# Patient Record
Sex: Male | Born: 1990
Health system: Southern US, Community
[De-identification: ages and names within clinical notes are randomized; demographics above are authoritative.]

## PROBLEM LIST (undated history)

## (undated) DIAGNOSIS — Z8489 Family history of other specified conditions: Secondary | ICD-10-CM

## (undated) DIAGNOSIS — M549 Dorsalgia, unspecified: Secondary | ICD-10-CM

## (undated) DIAGNOSIS — G8929 Other chronic pain: Secondary | ICD-10-CM

## (undated) HISTORY — PX: APPENDECTOMY: SHX54

---

## 2011-08-20 ENCOUNTER — Encounter (HOSPITAL_COMMUNITY): Payer: Self-pay | Admitting: Emergency Medicine

## 2011-08-20 ENCOUNTER — Emergency Department (HOSPITAL_COMMUNITY): Payer: Self-pay

## 2011-08-20 ENCOUNTER — Emergency Department (HOSPITAL_COMMUNITY)
Admission: EM | Admit: 2011-08-20 | Discharge: 2011-08-21 | Disposition: A | Discharge status: Home Health | Payer: Self-pay | Attending: Emergency Medicine | Admitting: Emergency Medicine

## 2011-08-20 ENCOUNTER — Emergency Department (HOSPITAL_COMMUNITY)
Admission: EM | Admit: 2011-08-20 | Discharge: 2011-08-20 | Discharge status: Against Medical Advice | Payer: Self-pay | Attending: Emergency Medicine | Admitting: Emergency Medicine

## 2011-08-20 DIAGNOSIS — Z8614 Personal history of Methicillin resistant Staphylococcus aureus infection: Secondary | ICD-10-CM | POA: Insufficient documentation

## 2011-08-20 DIAGNOSIS — Z0389 Encounter for observation for other suspected diseases and conditions ruled out: Secondary | ICD-10-CM | POA: Insufficient documentation

## 2011-08-20 DIAGNOSIS — L02619 Cutaneous abscess of unspecified foot: Secondary | ICD-10-CM | POA: Insufficient documentation

## 2011-08-20 MED ORDER — TETANUS-DIPHTH-ACELL PERTUSSIS 5-2.5-18.5 LF-MCG/0.5 IM SUSP
0.5000 mL | Freq: Once | INTRAMUSCULAR | Status: AC
  Administered 2011-08-20: 0.5 mL via INTRAMUSCULAR
  Filled 2011-08-20: qty 0.5

## 2011-08-20 MED ORDER — LIDOCAINE-EPINEPHRINE 2 %-1:100000 IJ SOLN
20.0000 mL | Freq: Once | INTRAMUSCULAR | Status: DC
  Filled 2011-08-20: qty 20

## 2011-08-20 NOTE — ED Notes (Signed)
Pt alert, nad, c/o blister like area to right foot, callous like, no s/s inflammation noted, skin pwd

## 2011-08-20 NOTE — ED Notes (Signed)
Patient leaving before triage completed--states he's going to [facility].

## 2011-08-20 NOTE — ED Provider Notes (Signed)
History     CSN: [id number]  Arrival date & time 08/20/11  2143   First MD Initiated Contact with Patient 08/20/11 2245      Chief Complaint  Patient presents with  . Wound Check    (Consider location/radiation/quality/duration/timing/severity/associated sxs/prior treatment) HPI  21 year old male with history of MRSA is presenting to the ED with chief complaints of right foot infection. Patient states for the past several days he has been helping friends moving furniture is. He has been doing a lot of walking and he has noticed a blister to the sole of his foot for the past few days. The area is very tender. He also noticed a red streak extending from that blister upward to his ankle. He denies change of shoes, foreign object sensation, or any recent trauma. He does not recall his last tetanus shot. He does have a history of MRSA infection. He denies fever, nausea, vomiting, diarrhea, or numbness. He denies pain with ankle movement.  History reviewed. No pertinent past medical history.  History reviewed. No pertinent past surgical history.  No family history on file.  History  Substance Use Topics  . Smoking status: Never Smoker   . Smokeless tobacco: Not on file  . Alcohol Use: No      Review of Systems  All other systems reviewed and are negative.    Allergies  Sulfa drugs cross reactors  Home Medications  No current outpatient prescriptions on file.  BP 128/74  Pulse 69  Temp(Src) 98.3 F (36.8 C) (Oral)  Resp 18  Wt 140 lb (63.504 kg)  SpO2 97%  Physical Exam  Nursing note and vitals reviewed. Constitutional: He appears well-developed and well-nourished. No distress.  HENT:  Head: Atraumatic.  Eyes: Conjunctivae are normal.  Neck: Neck supple.  Musculoskeletal:       Right foot: An area of induration and fluctuant with ecchymosis noted to the sole of feet. There is moderate tender to palpation. A red streak extending from it upward to medial aspects of  ankle. Right ankle with full range of motion. Wrists capillary refill is to all toes. Dorsalis pedis pulse 2+. No foreign object noted. No air noted.  Neurological: He is alert.  Skin: Skin is warm.    ED Course  Procedures (including critical care time)  Labs Reviewed - No data to display No results found.   No diagnosis found.  No results found for this or any previous visit. Dg Foot Complete Right  08/20/2011  *RADIOLOGY REPORT*  Clinical Data: Evaluate plantar soft tissue wound along the right great toe.  Assess for foreign body, soft tissue air or osteomyelitis.  RIGHT FOOT COMPLETE - 3+ VIEW  Comparison: None.  Findings: The clinically described soft tissue wound is not well characterized on radiograph.  No soft tissue air is identified. There is no evidence of osseous erosion to suggest osteomyelitis. Visualized joint spaces are preserved.  No radiopaque foreign bodies are identified.  There is no evidence of fracture.  The subtalar joint is within normal limits.  Soft tissue swelling is noted along the medial aspect of the hindfoot.  IMPRESSION: No radiopaque foreign bodies seen; no soft tissue air identified. No evidence of osseous erosion to suggest osteomyelitis.  Soft tissue swelling noted along the medial aspect of the hindfoot.  Original Report Authenticated By: Tonia Ghent, M.D.    INCISION AND DRAINAGE Performed by: Fayrene Helper Consent: Verbal consent obtained. Risks and benefits: risks, benefits and alternatives were discussed Type: abscess  Body  area: sole of Right foot  Anesthesia: local infiltration  Local anesthetic: lidocaine 2% w epinephrine  Anesthetic total: 3 ml  Complexity: complex Blunt dissection to break up loculations  Drainage: purulent  Drainage amount: minimal  Packing material: 1/4 in iodoform gauze  Patient tolerance: Patient tolerated the procedure well with no immediate complications.    MDM  An abscess noted to sole of right foot.  Mild pus or exudates extremities from wound from I&D. Lymphangitis to the affected area. X-ray shows no evidence of foreign object, osteomyelitis, or air.  I have low suspicion for necrotizing fasciitis. The wound were evaluated both by myself and my attending. Strict followup instruction given. Patient will be discharged with clinda, and pain medication.  Pt with hx of MRSA, i doubt keflex will be efficient.   Patient voices understanding and agree with plan. He has stable vital signs and is afebrile.  Tetanus shot given today in ED.  Crutches given for comfort        Fayrene Helper, PA-C 08/21/11 0016

## 2011-08-21 MED ORDER — CLINDAMYCIN HCL 300 MG PO CAPS
300.0000 mg | ORAL_CAPSULE | Freq: Once | ORAL | Status: AC
  Administered 2011-08-21: 300 mg via ORAL
  Filled 2011-08-21: qty 1

## 2011-08-21 MED ORDER — HYDROCODONE-ACETAMINOPHEN 5-325 MG PO TABS: 2.0000 | ORAL_TABLET | ORAL | Status: AC | PRN

## 2011-08-21 MED ORDER — CLINDAMYCIN HCL 150 MG PO CAPS: 150.0000 mg | ORAL_CAPSULE | Freq: Four times a day (QID) | ORAL | Status: AC

## 2011-08-21 NOTE — Discharge Instructions (Signed)
Please take antibiotic and pain medication for the full duration.  Keep packing in place for 2 days.  Return to ER or to your regular doctor for wound recheck in 2 days.  Return sooner if you develop fever, worsening extending red streak, increase pain, or if you have any other concerns.    Abscess An abscess (boil or furuncle) is an infected area that contains a collection of pus.  SYMPTOMS Signs and symptoms of an abscess include pain, tenderness, redness, or hardness. You may feel a moveable soft area under your skin. An abscess can occur anywhere in the body.  TREATMENT  A surgical cut (incision) may be made over your abscess to drain the pus. Gauze may be packed into the space or a drain may be looped through the abscess cavity (pocket). This provides a drain that will allow the cavity to heal from the inside outwards. The abscess may be painful for a few days, but should feel much better if it was drained.  Your abscess, if seen early, may not have localized and may not have been drained. If not, another appointment may be required if it does not get better on its own or with medications. HOME CARE INSTRUCTIONS   Only take over-the-counter or prescription medicines for pain, discomfort, or fever as directed by your caregiver.   Take your antibiotics as directed if they were prescribed. Finish them even if you start to feel better.   Keep the skin and clothes clean around your abscess.   If the abscess was drained, you will need to use gauze dressing to collect any draining pus. Dressings will typically need to be changed 3 or more times a day.   The infection may spread by skin contact with others. Avoid skin contact as much as possible.   Practice good hygiene. This includes regular hand washing, cover any draining skin lesions, and do not share personal care items.   If you participate in sports, do not share athletic equipment, towels, whirlpools, or personal care items. Shower after  every practice or tournament.   If a draining area cannot be adequately covered:   Do not participate in sports.   Children should not participate in day care until the wound has healed or drainage stops.   If your caregiver has given you a follow-up appointment, it is very important to keep that appointment. Not keeping the appointment could result in a much worse infection, chronic or permanent injury, pain, and disability. If there is any problem keeping the appointment, you must call back to this facility for assistance.  SEEK MEDICAL CARE IF:   You develop increased pain, swelling, redness, drainage, or bleeding in the wound site.   You develop signs of generalized infection including muscle aches, chills, fever, or a general ill feeling.   You have an oral temperature above 102 F (38.9 C).  MAKE SURE YOU:   Understand these instructions.   Will watch your condition.   Will get help right away if you are not doing well or get worse.  Document Released: 03/27/2005 Document Revised: 02/27/2011 Document Reviewed: 01/19/2008 [facility] Patient Information 2012 [place], [place].  Community-Associated MRSA CA-MRSA stands for community-associated methicillin-resistant Staphylococcus aureus. MRSA is a type of bacteria that is resistant to some common antibiotics. It can cause infections in the skin and many other places in the body. Staphylococcus aureus, often called "staph," is a bacteria that normally lives on the skin or in the nose. Staph on the surface  of the skin or in the nose does not cause problems. However, if the staph enters the body through a cut, wound, or break in the skin, an infection can happen. Up until recently, infections with the MRSA type of staph mainly occurred in hospitals and other healthcare settings. There are now increasing problems with MRSA infections in the community as well. Infections with MRSA may be very serious or even life-threatening. CA-MRSA is  becoming more common. It is known to spread in crowded settings, in jails and prisons, and in situations where there is close skin-to-skin contact, such as during sporting events or in locker rooms. MRSA can be spread through shared items, such as children's toys, razors, towels, or sports equipment.  CAUSES All staph, including MRSA, are normally harmless unless they enter the body through a scratch, cut, or wound, such as with surgery. All staph, including MRSA, can be spread from person-to-person by touching contaminated objects or through direct contact.  MRSA now causes illness in people who have not been in hospitals or other healthcare facilities. Cases of MRSA diseases in the community have been associated with:   Recent antibiotic use.   Sharing contaminated towels or clothes.   Having active skin diseases.   Participating in contact sports.   Living in crowded settings.   Intravenous (IV) drug use.   Community-associated MRSA infections are usually skin infections, but may cause other severe illnesses.   Staph bacteria are one of the most common causes of skin infection. However, they are also a common cause of pneumonia, bone or joint infections, and bloodstream infections.  DIAGNOSIS Diagnosis of MRSA is done by cultures of fluid samples that may come from:  Swabs taken from cuts or wounds in infected areas.   Nasal swabs.   Saliva or deep cough specimens from the lungs (sputum).   Urine.   Blood.  Many people are "colonized" with MRSA but have no signs of infection. This means that people carry the MRSA germ on their skin or in their nose and may never develop MRSA infection.  TREATMENT  Treatment varies and is based on how serious, how deep, or how extensive the infection is. For example:  Some skin infections, such as a small boil or abscess, may be treated by draining yellowish-white fluid (pus) from the site of the infection.   Deeper or more widespread soft  tissue infections are usually treated with surgery to drain pus and with antibiotic medicine given by vein or by mouth. This may be recommended even if you are pregnant.   Serious infections may require a hospital stay.  If antibiotics are given, they may be needed for several weeks. PREVENTION Because many people are colonized with staph, including MRSA, preventing the spread of the bacteria from person-to-person is most important. The best way to prevent the spread of bacteria and other germs is through proper hand washing or by using alcohol-based hand disinfectants. The following are other ways to help prevent MRSA infection within community settings.   Wash your hands frequently with soap and water for at least 15 seconds. Otherwise, use alcohol-based hand disinfectants when soap and water is not available.   Make sure people who live with you wash their hands often, too.   Do not share personal items. For example, avoid sharing razors and other personal hygiene items, towels, clothing, and athletic equipment.   Wash and dry your clothes and bedding at the warmest temperatures recommended on the labels.   Keep wounds  covered. Pus from infected sores may contain MRSA and other bacteria. Keep cuts and abrasions clean and covered with germ-free (sterile), dry bandages until they are healed.   If you have a wound that appears infected, ask your caregiver if a culture for MRSA and other bacteria should be done.   If you are breastfeeding, talk to your caregiver about MRSA. You may be asked to temporarily stop breastfeeding.  HOME CARE INSTRUCTIONS   Take your antibiotics as directed. Finish them even if you start to feel better.   Avoid close contact with those around you as much as possible. Do not use towels, razors, toothbrushes, bedding, or other items that will be used by others.   To fight the infection, follow your caregiver's instructions for wound care. Wash your hands before and  after changing your bandages.   If you have an intravascular device, such as a catheter, make sure you know how to care for it.   Be sure to tell any healthcare providers that you have MRSA so they are aware of your infection.  SEEK IMMEDIATE MEDICAL CARE IF:  The infection appears to be getting worse. Signs include:   Increased warmth, redness, or tenderness around the wound site.   A red line that extends from the infection site.   A dark color in the area around the infection.   Wound drainage that is tan, yellow, or green.   A bad smell coming from the wound.   You feel sick to your stomach (nauseous) and throw up (vomit) or cannot keep medicine down.   You have a fever.   Your baby is older than 3 months with a rectal temperature of 102 F (38.9 C) or higher.   Your baby is 72 months old or younger with a rectal temperature of 100.4 F (38 C) or higher.   You have difficulty breathing.  MAKE SURE YOU:   Understand these instructions.   Will watch your condition.   Will get help right away if you are not doing well or get worse.  Document Released: 09/20/2005 Document Revised: 02/27/2011 Document Reviewed: 09/20/2010 [facility] Patient Information 2012 [place], [place].  Cellulitis Cellulitis is an infection of the skin and the tissue beneath it. The area is typically red and tender. It is caused by germs (bacteria) (usually staph or strep) that enter the body through cuts or sores. Cellulitis most commonly occurs in the arms or lower legs.  HOME CARE INSTRUCTIONS   If you are given a prescription for medications which kill germs (antibiotics), take as directed until finished.   If the infection is on the arm or leg, keep the limb elevated as able.   Use a warm cloth several times per day to relieve pain and encourage healing.   See your caregiver for recheck of the infected site as directed if problems arise.   Only take over-the-counter or prescription medicines  for pain, discomfort, or fever as directed by your caregiver.  SEEK MEDICAL CARE IF:   The area of redness (inflammation) is spreading, there are red streaks coming from the infected site, or if a part of the infection begins to turn dark in color.   The joint or bone underneath the infected skin becomes painful after the skin has healed.   The infection returns in the same or another area after it seems to have gone away.   A boil or bump swells up. This may be an abscess.   New, unexplained problems such  as pain or fever develop.  SEEK IMMEDIATE MEDICAL CARE IF:   You have a fever.   You or your child feels drowsy or lethargic.   There is vomiting, diarrhea, or lasting discomfort or feeling ill (malaise) with muscle aches and pains.  MAKE SURE YOU:   Understand these instructions.   Will watch your condition.   Will get help right away if you are not doing well or get worse.  Document Released: 03/27/2005 Document Revised: 02/27/2011 Document Reviewed: 02/03/2008 [facility] Patient Information 2012 [place], [place].

## 2011-08-24 NOTE — ED Provider Notes (Signed)
Medical screening examination/treatment/procedure(s) were performed by non-physician practitioner and as supervising physician I was immediately available for consultation/collaboration.   Thereasa Iannello A. Natori Gudino, MD 08/24/11 0655 

## 2011-09-02 ENCOUNTER — Encounter (HOSPITAL_COMMUNITY): Payer: Self-pay

## 2011-09-02 ENCOUNTER — Emergency Department (HOSPITAL_COMMUNITY)
Admission: EM | Admit: 2011-09-02 | Discharge: 2011-09-02 | Disposition: A | Discharge status: Home / Self Care | Payer: No Typology Code available for payment source | Attending: Emergency Medicine | Admitting: Emergency Medicine

## 2011-09-02 DIAGNOSIS — M25519 Pain in unspecified shoulder: Secondary | ICD-10-CM | POA: Insufficient documentation

## 2011-09-02 DIAGNOSIS — M25559 Pain in unspecified hip: Secondary | ICD-10-CM | POA: Insufficient documentation

## 2011-09-02 MED ORDER — DIAZEPAM 5 MG PO TABS: 5.0000 mg | ORAL_TABLET | Freq: Two times a day (BID) | ORAL | Status: AC
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
## 2011-09-03 NOTE — ED Provider Notes (Signed)
Medical screening examination/treatment/procedure(s) were performed by non-physician practitioner and as supervising physician I was immediately available for consultation/collaboration.   Elsye Mccollister M Bryceton Hantz, MD 09/03/11 0939 

## 2012-05-26 ENCOUNTER — Emergency Department (HOSPITAL_COMMUNITY)
Admission: EM | Admit: 2012-05-26 | Discharge: 2012-05-26 | Disposition: A | Discharge status: Home / Self Care | Payer: Worker's Compensation | Attending: Emergency Medicine | Admitting: Emergency Medicine

## 2012-05-26 ENCOUNTER — Emergency Department (HOSPITAL_COMMUNITY): Payer: Worker's Compensation

## 2012-05-26 ENCOUNTER — Encounter (HOSPITAL_COMMUNITY): Payer: Self-pay | Admitting: Emergency Medicine

## 2012-05-26 DIAGNOSIS — Z79899 Other long term (current) drug therapy: Secondary | ICD-10-CM | POA: Insufficient documentation

## 2012-05-26 DIAGNOSIS — Y939 Activity, unspecified: Secondary | ICD-10-CM | POA: Insufficient documentation

## 2012-05-26 DIAGNOSIS — Y929 Unspecified place or not applicable: Secondary | ICD-10-CM | POA: Insufficient documentation

## 2012-05-26 DIAGNOSIS — S6390XA Sprain of unspecified part of unspecified wrist and hand, initial encounter: Secondary | ICD-10-CM | POA: Insufficient documentation

## 2012-05-26 DIAGNOSIS — S63601A Unspecified sprain of right thumb, initial encounter: Secondary | ICD-10-CM

## 2012-05-26 DIAGNOSIS — X58XXXA Exposure to other specified factors, initial encounter: Secondary | ICD-10-CM | POA: Insufficient documentation

## 2012-05-26 NOTE — ED Notes (Signed)
Ortho at bedside.

## 2012-05-26 NOTE — ED Notes (Signed)
Pt has notably swollen right thumb with limited movement. Pt A&Ox4, ambulatory, nad.

## 2012-05-26 NOTE — Progress Notes (Signed)
Orthopedic Tech Progress Note Patient Details:  Dakota Goodman [facility] 06-29-1991 [id number]  Ortho Devices Type of Ortho Device: Thumb velcro splint Ortho Device/Splint Location: (R) UE Ortho Device/Splint Interventions: Application   Jennye Moccasin 05/26/2012, 5:02 PM

## 2012-05-26 NOTE — ED Notes (Signed)
Discharge instructions complete. Pt verbalized understanding.

## 2012-05-26 NOTE — ED Notes (Signed)
Pt returned from xray

## 2012-05-26 NOTE — ED Notes (Signed)
Rt thumb pain x 1 week now swollen makes  feels like it pops in and out at times

## 2012-05-26 NOTE — ED Notes (Signed)
Pt transported to xray 

## 2012-05-26 NOTE — ED Notes (Signed)
Ortho notified

## 2012-05-26 NOTE — ED Provider Notes (Signed)
History  Scribed for Carleene Cooper III, MD, the patient was seen in room TR09C/TR09C. This chart was scribed by Candelaria Stagers. The patient's care started at 3:46 PM   CSN: [id number]  Arrival date & time 05/26/12  1429   None     No chief complaint on file.    The history is provided by the patient. No language interpreter was used.   Dakota Goodman is a 21 y.o. male who presents to the Emergency Department complaining of right thumb pain that started about two weeks ago and has gotten worse.  He is now experiencing swelling of the right thumb.  Pt reports that the thumb pops in and out of joint.  He states that he jammed the thumb at work two weeks ago.  Nothing seems to make the pain better or worse.    History reviewed. No pertinent past medical history.  No past surgical history on file.  No family history on file.  History  Substance Use Topics  . Smoking status: Never Smoker   . Smokeless tobacco: Not on file  . Alcohol Use: No      Review of Systems  Musculoskeletal: Positive for arthralgias (right thumb pain).  All other systems reviewed and are negative.    Allergies  Sulfa drugs cross reactors  Home Medications   Current Outpatient Rx  Name  Route  Sig  Dispense  Refill  . B COMPLEX PO TABS   Oral   Take 1 tablet by mouth daily.         . IBUPROFEN 200 MG PO TABS   Oral   Take 600 mg by mouth every 6 (six) hours as needed. For pain         . PRENATAL MULTIVITAMIN CH   Oral   Take 1 tablet by mouth daily.           BP 133/73  Pulse 117  Temp 98.3 F (36.8 C)  Resp 20  SpO2 100%  Physical Exam  Nursing note and vitals reviewed. Constitutional: He is oriented to person, place, and time. He appears well-developed and well-nourished. No distress.  HENT:  Head: Normocephalic and atraumatic.  Eyes: EOM are normal.  Neck: Neck supple. No tracheal deviation present.  Pulmonary/Chest: Effort normal. No respiratory distress.    Musculoskeletal: Normal range of motion.       Limited ROM at the metacarpal phalangeal joint of the right thumb.  Swelling over the thenar eminence.  Intact sensation and tendon function.    Neurological: He is alert and oriented to person, place, and time.  Skin: Skin is warm and dry.  Psychiatric: He has a normal mood and affect. His behavior is normal.    ED Course  Procedures   DIAGNOSTIC STUDIES: Oxygen Saturation is 100% on room air, normal by my interpretation.    COORDINATION OF CARE:   15:52 Ordered: DG Finger Thumb Right   Labs Reviewed - No data to display Dg Finger Thumb Right  05/26/2012  *RADIOLOGY REPORT*  Clinical Data: Pain and swelling for 2 weeks.  Possible injury at work.  RIGHT THUMB 2+V  Comparison: None.  Findings: The mineralization and alignment are normal.  There is no evidence of acute fracture or dislocation.  The joint spaces are preserved.  No foreign bodies are identified.  There may be mild soft tissue swelling surrounding the metacarpal phalangeal joint.  IMPRESSION: No acute osseous findings.   Original Report Authenticated By: Carey Bullocks, M.D.  4:48 PM X-rays were negative.  Rx thumb spica splint, F/U prn with Dr. Betha Loa, on call for hand surgery today.  1. Sprain of right thumb    I personally performed the services described in this documentation, which was scribed in my presence. The recorded information has been reviewed and is accurate. Osvaldo Human, MD      Carleene Cooper III, MD 05/26/12 [phone]

## 2016-09-07 ENCOUNTER — Emergency Department (HOSPITAL_COMMUNITY): Payer: Self-pay

## 2016-09-07 ENCOUNTER — Inpatient Hospital Stay (HOSPITAL_COMMUNITY)
Admission: EM | Admit: 2016-09-07 | Discharge: 2016-09-11 | DRG: 373 | Disposition: A | Discharge status: Home / Self Care | Payer: Self-pay | Attending: Surgery | Admitting: Surgery

## 2016-09-07 ENCOUNTER — Encounter (HOSPITAL_COMMUNITY): Payer: Self-pay | Admitting: Emergency Medicine

## 2016-09-07 DIAGNOSIS — K352 Acute appendicitis with generalized peritonitis, without abscess: Secondary | ICD-10-CM

## 2016-09-07 DIAGNOSIS — K3532 Acute appendicitis with perforation and localized peritonitis, without abscess: Secondary | ICD-10-CM | POA: Diagnosis present

## 2016-09-07 DIAGNOSIS — K353 Acute appendicitis with localized peritonitis: Principal | ICD-10-CM | POA: Diagnosis present

## 2016-09-07 HISTORY — DX: Other chronic pain: G89.29

## 2016-09-07 HISTORY — DX: Family history of other specified conditions: Z84.89

## 2016-09-07 HISTORY — PX: ABSCESS DRAINAGE: SHX1119

## 2016-09-07 HISTORY — DX: Dorsalgia, unspecified: M54.9

## 2016-09-07 LAB — URINALYSIS, MICROSCOPIC (REFLEX)
SQUAMOUS EPITHELIAL / LPF: NONE SEEN
WBC UA: NONE SEEN WBC/hpf (ref 0–5)

## 2016-09-07 LAB — CBC
HCT: 51.8 % (ref 39.0–52.0)
Hemoglobin: 18.4 g/dL — ABNORMAL HIGH (ref 13.0–17.0)
MCH: 32.2 pg (ref 26.0–34.0)
MCHC: 35.5 g/dL (ref 30.0–36.0)
MCV: 90.7 fL (ref 78.0–100.0)
Platelets: 226 10*3/uL (ref 150–400)
RBC: 5.71 MIL/uL (ref 4.22–5.81)
RDW: 12.7 % (ref 11.5–15.5)
WBC: 7.3 10*3/uL (ref 4.0–10.5)

## 2016-09-07 LAB — URINALYSIS, ROUTINE W REFLEX MICROSCOPIC
GLUCOSE, UA: NEGATIVE mg/dL
LEUKOCYTES UA: NEGATIVE
NITRITE: NEGATIVE
Protein, ur: 100 mg/dL — AB
Specific Gravity, Urine: 1.02 (ref 1.005–1.030)
pH: 6.5 (ref 5.0–8.0)

## 2016-09-07 LAB — COMPREHENSIVE METABOLIC PANEL
ALT: 8 U/L — ABNORMAL LOW (ref 17–63)
AST: 14 U/L — ABNORMAL LOW (ref 15–41)
Albumin: 3.7 g/dL (ref 3.5–5.0)
Alkaline Phosphatase: 75 U/L (ref 38–126)
Anion gap: 13 (ref 5–15)
BILIRUBIN TOTAL: 0.8 mg/dL (ref 0.3–1.2)
BUN: 18 mg/dL (ref 6–20)
CHLORIDE: 89 mmol/L — AB (ref 101–111)
CO2: 30 mmol/L (ref 22–32)
Calcium: 9.5 mg/dL (ref 8.9–10.3)
Creatinine, Ser: 0.82 mg/dL (ref 0.61–1.24)
Glucose, Bld: 120 mg/dL — ABNORMAL HIGH (ref 65–99)
POTASSIUM: 3.9 mmol/L (ref 3.5–5.1)
Sodium: 132 mmol/L — ABNORMAL LOW (ref 135–145)
TOTAL PROTEIN: 7.8 g/dL (ref 6.5–8.1)

## 2016-09-07 LAB — LIPASE, BLOOD

## 2016-09-07 MED ORDER — PIPERACILLIN-TAZOBACTAM 3.375 G IVPB 30 MIN
3.3750 g | Freq: Once | INTRAVENOUS | Status: AC
  Administered 2016-09-07: 3.375 g via INTRAVENOUS
  Filled 2016-09-07: qty 50

## 2016-09-07 MED ORDER — IOPAMIDOL (ISOVUE-300) INJECTION 61%
INTRAVENOUS | Status: AC
  Administered 2016-09-07: 100 mL
  Filled 2016-09-07: qty 100

## 2016-09-07 MED ORDER — ONDANSETRON 4 MG PO TBDP
4.0000 mg | ORAL_TABLET | Freq: Four times a day (QID) | ORAL | Status: DC | PRN
  Administered 2016-09-09: 4 mg via ORAL
  Filled 2016-09-07: qty 1

## 2016-09-07 MED ORDER — LACTATED RINGERS IV BOLUS (SEPSIS): 1000.0000 mL | Freq: Once | INTRAVENOUS | Status: DC

## 2016-09-07 MED ORDER — ENOXAPARIN SODIUM 40 MG/0.4ML ~~LOC~~ SOLN
40.0000 mg | SUBCUTANEOUS | Status: DC
  Administered 2016-09-09 – 2016-09-11 (×3): 40 mg via SUBCUTANEOUS
  Filled 2016-09-07 (×3): qty 0.4

## 2016-09-07 MED ORDER — KCL IN DEXTROSE-NACL 20-5-0.9 MEQ/L-%-% IV SOLN
INTRAVENOUS | Status: DC
  Administered 2016-09-07 – 2016-09-10 (×4): via INTRAVENOUS
  Filled 2016-09-07 (×5): qty 1000

## 2016-09-07 MED ORDER — ONDANSETRON HCL 4 MG/2ML IJ SOLN
4.0000 mg | Freq: Four times a day (QID) | INTRAMUSCULAR | Status: DC | PRN
  Administered 2016-09-08 – 2016-09-11 (×5): 4 mg via INTRAVENOUS
  Filled 2016-09-07 (×6): qty 2

## 2016-09-07 MED ORDER — ACETAMINOPHEN 325 MG PO TABS
650.0000 mg | ORAL_TABLET | Freq: Four times a day (QID) | ORAL | Status: DC | PRN
  Administered 2016-09-09: 650 mg via ORAL
  Filled 2016-09-07: qty 2

## 2016-09-07 MED ORDER — ONDANSETRON HCL 4 MG/2ML IJ SOLN
4.0000 mg | Freq: Once | INTRAMUSCULAR | Status: AC
  Administered 2016-09-07: 4 mg via INTRAVENOUS
  Filled 2016-09-07: qty 2

## 2016-09-07 MED ORDER — HYDROMORPHONE HCL 2 MG/ML IJ SOLN
1.0000 mg | INTRAMUSCULAR | Status: DC | PRN
  Administered 2016-09-07 – 2016-09-10 (×8): 1 mg via INTRAVENOUS
  Filled 2016-09-07 (×9): qty 1

## 2016-09-07 MED ORDER — ACETAMINOPHEN 650 MG RE SUPP: 650.0000 mg | Freq: Four times a day (QID) | RECTAL | Status: DC | PRN

## 2016-09-07 MED ORDER — DIPHENHYDRAMINE HCL 50 MG/ML IJ SOLN: 12.5000 mg | Freq: Four times a day (QID) | INTRAMUSCULAR | Status: DC | PRN

## 2016-09-07 MED ORDER — MORPHINE SULFATE (PF) 4 MG/ML IV SOLN
4.0000 mg | Freq: Once | INTRAVENOUS | Status: AC
  Administered 2016-09-07: 4 mg via INTRAVENOUS
  Filled 2016-09-07: qty 1

## 2016-09-07 MED ORDER — DIPHENHYDRAMINE HCL 12.5 MG/5ML PO ELIX: 12.5000 mg | ORAL_SOLUTION | Freq: Four times a day (QID) | ORAL | Status: DC | PRN

## 2016-09-07 MED ORDER — DICYCLOMINE HCL 10 MG PO CAPS
10.0000 mg | ORAL_CAPSULE | Freq: Once | ORAL | Status: AC
  Administered 2016-09-07: 10 mg via ORAL
  Filled 2016-09-07: qty 1

## 2016-09-07 MED ORDER — LACTATED RINGERS IV BOLUS (SEPSIS)
1000.0000 mL | Freq: Once | INTRAVENOUS | Status: AC
  Administered 2016-09-07: 1000 mL via INTRAVENOUS

## 2016-09-07 MED ORDER — PIPERACILLIN-TAZOBACTAM 3.375 G IVPB
3.3750 g | Freq: Three times a day (TID) | INTRAVENOUS | Status: DC
  Administered 2016-09-07 – 2016-09-10 (×8): 3.375 g via INTRAVENOUS
  Filled 2016-09-07 (×9): qty 50

## 2016-09-07 NOTE — ED Provider Notes (Signed)
[facility]-EMERGENCY DEPT Provider Note   CSN: [id number] Arrival date & time: 09/07/16  1125     History   Chief Complaint Chief Complaint  Patient presents with  . Abdominal Pain  . Nausea    HPI Dakota Goodman is a 26 y.o. male.  The history is provided by the patient. No language interpreter was used.  Abdominal Pain     Dakota Goodman is a 26 y.o. male who presents to the Emergency Department complaining of abdominal pain. He reports 5 days of abdominal pain with nausea and vomiting. 5 days ago he developed upper abdominal discomfort and cramping with associated nausea and multiple episodes of emesis. His vomiting resolved 3 days ago and he had some mild diarrhea. His pain has now migrated to his left abdomen in this severe in nature. Pain is worse with movement. No fevers but he has felt warm at home. No dysuria. No known sick contacts or bad food exposures. He has no known medical problems and no prior surgeries. History reviewed. No pertinent past medical history.  There are no active problems to display for this patient.   History reviewed. No pertinent surgical history.     Home Medications    Prior to Admission medications   Medication Sig Start Date End Date Taking? Authorizing Provider  b complex vitamins tablet Take 1 tablet by mouth daily.    Historical Provider, MD  ibuprofen (ADVIL,MOTRIN) 200 MG tablet Take 600 mg by mouth every 6 (six) hours as needed. For pain    Historical Provider, MD  Prenatal Vit-Fe Fumarate-FA (PRENATAL MULTIVITAMIN) TABS Take 1 tablet by mouth daily.    Historical Provider, MD    Family History No family history on file.  Social History Social History  Substance Use Topics  . Smoking status: Never Smoker  . Smokeless tobacco: Never Used  . Alcohol use Yes     Allergies   Sulfa drugs cross reactors   Review of Systems Review of Systems  Gastrointestinal: Positive for abdominal pain.  All other systems reviewed and  are negative.    Physical Exam Updated Vital Signs BP 119/70   Pulse 77   Temp 98 F (36.7 C) (Oral)   Resp 18   Ht 6\' 2"  (1.88 m)   Wt 160 lb (72.6 kg)   SpO2 99%   BMI 20.54 kg/m   Physical Exam  Constitutional: He is oriented to person, place, and time. He appears well-developed and well-nourished.  HENT:  Head: Normocephalic and atraumatic.  Cardiovascular: Normal rate and regular rhythm.   No murmur heard. Pulmonary/Chest: Effort normal and breath sounds normal. No respiratory distress.  Abdominal: Soft.  Diffuse abdominal tenderness with moderate to severe left-sided abdominal tenderness with voluntary guarding  Musculoskeletal: He exhibits no edema or tenderness.  Neurological: He is alert and oriented to person, place, and time.  Skin: Skin is warm and dry.  Psychiatric: He has a normal mood and affect. His behavior is normal.  Nursing note and vitals reviewed.    ED Treatments / Results  Labs (all labs ordered are listed, but only abnormal results are displayed) Labs Reviewed  LIPASE, BLOOD - Abnormal; Notable for the following:       Result Value   Lipase <10 (*)    All other components within normal limits  COMPREHENSIVE METABOLIC PANEL - Abnormal; Notable for the following:    Sodium 132 (*)    Chloride 89 (*)    Glucose, Bld 120 (*)  AST 14 (*)    ALT 8 (*)    All other components within normal limits  CBC - Abnormal; Notable for the following:    Hemoglobin 18.4 (*)    All other components within normal limits  URINALYSIS, ROUTINE W REFLEX MICROSCOPIC - Abnormal; Notable for the following:    APPearance HAZY (*)    Hgb urine dipstick TRACE (*)    Bilirubin Urine MODERATE (*)    Ketones, ur >80 (*)    Protein, ur 100 (*)    All other components within normal limits  URINALYSIS, MICROSCOPIC (REFLEX) - Abnormal; Notable for the following:    Bacteria, UA FEW (*)    All other components within normal limits    EKG  EKG  Interpretation None       Radiology Ct Abdomen Pelvis W Contrast  Result Date: 09/07/2016 CLINICAL DATA:  Nausea and vomiting.  Started last Monday. EXAM: CT ABDOMEN AND PELVIS WITH CONTRAST TECHNIQUE: Multidetector CT imaging of the abdomen and pelvis was performed using the standard protocol following bolus administration of intravenous contrast. CONTRAST:  1 ISOVUE-300 IOPAMIDOL (ISOVUE-300) INJECTION 61% COMPARISON:  None. FINDINGS: Lower chest: No acute abnormality. Hepatobiliary: No focal liver abnormality is seen. No gallstones, gallbladder wall thickening, or biliary dilatation. Pancreas: Unremarkable. No pancreatic ductal dilatation or surrounding inflammatory changes. Spleen: Normal in size without focal abnormality. Adrenals/Urinary Tract: Adrenal glands are unremarkable. Kidneys are normal, without renal calculi, focal lesion, or hydronephrosis. Bladder is unremarkable. Stomach/Bowel: Dilated appendix measuring 11 mm with mucosal enhancement most concerning for acute appendicitis. At the base of the appendix there is mucosal discontinuity along filled with fluid concerning for ruptured appendicitis. 4.2 x 2.7 cm fluid collection in the right lower abdomen most consistent with an abscess. Multiple small bowel air-fluid levels with mild bowel wall thickening concerning for enteritis. No pneumatosis, pneumoperitoneum or portal venous gas. Vascular/Lymphatic: No significant vascular findings are present. No enlarged abdominal or pelvic lymph nodes. Reproductive: Prostate is unremarkable. Other: No abdominal wall hernia or abnormality. No abdominopelvic ascites. Musculoskeletal: No acute or significant osseous findings. IMPRESSION: 1. Findings most consistent with acute ruptured appendicitis. 4.2 x 2.7 cm fluid collection in the right lower abdomen most consistent with an abscess. Moderate amount of pelvic fluid. 2. Multiple small bowel air-fluid levels with mild bowel wall thickening concerning for  enteritis secondary to appendiceal rupture. Critical Value/emergent results were called by telephone at the time of interpretation on 09/07/2016 at 3:44 pm to Dr. Tilden Fossa , who verbally acknowledged these results. Electronically Signed   By: Elige Ko   On: 09/07/2016 15:46    Procedures Procedures (including critical care time)  Medications Ordered in ED Medications  lactated ringers bolus 1,000 mL (not administered)  piperacillin-tazobactam (ZOSYN) IVPB 3.375 g (not administered)  lactated ringers bolus 1,000 mL (0 mLs Intravenous Stopped 09/07/16 1509)  ondansetron (ZOFRAN) injection 4 mg (4 mg Intravenous Given 09/07/16 1233)  dicyclomine (BENTYL) capsule 10 mg (10 mg Oral Given 09/07/16 1233)  iopamidol (ISOVUE-300) 61 % injection (100 mLs  Contrast Given 09/07/16 1516)     Initial Impression / Assessment and Plan / ED Course  I have reviewed the triage vital signs and the nursing notes.  Pertinent labs & imaging results that were available during my care of the patient were reviewed by me and considered in my medical decision making (see chart for details).     Patient here with abdominal pain, nausea, vomiting over the last 5 days. He is peritoneal  on evaluation with significant tenderness in the left lower quadrant. Labs consistent with mild dehydration. CT abdomen demonstrates acute appendicitis with abscess formation. He was started on IV antibiotics and Dr. Luisa Hart with General Surgery was consulted. Patient updated findings of studies recommendation for admission for further treatment.  Final Clinical Impressions(s) / ED Diagnoses   Final diagnoses:  Acute appendicitis with generalized peritonitis    New Prescriptions New Prescriptions   No medications on file     Tilden Fossa, MD 09/07/16 1556

## 2016-09-07 NOTE — ED Notes (Signed)
ED Provider at bedside. 

## 2016-09-07 NOTE — ED Notes (Signed)
Pt returns from ct scan. 

## 2016-09-07 NOTE — ED Notes (Signed)
Patient transported to CT 

## 2016-09-07 NOTE — ED Triage Notes (Signed)
Pt.stated, I started having stomach pain with some nausea. Started Monday night.

## 2016-09-07 NOTE — H&P (Signed)
Dakota Goodman is an 26 y.o. male.   Chief Complaint: Abdominal pain HPI: Asked to see patient at the request of Dr. Ayesha Rumpf of the emergency department for 1 week history of lower abdominal pain, nausea and vomiting. The patient states she developed abdominal pain that was diffuse on Monday this week. He had significant nausea and vomiting over the next 2-3 days. The pain location now is just over his pubic bone in the midline. It is more on the left and right side of this. It is made worse with movement. He denies fever or chills. Denies any diarrhea or bloody stool. He has not had this type of pain before in the past. No history of Crohn's disease or other inflammatory bowel disorders. CT scan shows a perforated appendix with a 4 cm periependymal this abscess extending toward the midline. He has no evidence of hemodynamic instability.  History reviewed. No pertinent past medical history.  History reviewed. No pertinent surgical history.  No family history on file. Social History:  reports that he has never smoked. He has never used smokeless tobacco. He reports that he drinks alcohol. His drug history is not on file.  Allergies:  Allergies  Allergen Reactions  . Sulfa Drugs Cross Reactors Hives     (Not in a hospital admission)  Results for orders placed or performed during the hospital encounter of 09/07/16 (from the past 48 hour(s))  Lipase, blood     Status: Abnormal   Collection Time: 09/07/16 11:54 AM  Result Value Ref Range   Lipase <10 (L) 11 - 51 U/L  Comprehensive metabolic panel     Status: Abnormal   Collection Time: 09/07/16 11:54 AM  Result Value Ref Range   Sodium 132 (L) 135 - 145 mmol/L   Potassium 3.9 3.5 - 5.1 mmol/L   Chloride 89 (L) 101 - 111 mmol/L   CO2 30 22 - 32 mmol/L   Glucose, Bld 120 (H) 65 - 99 mg/dL   BUN 18 6 - 20 mg/dL   Creatinine, Ser 0.82 0.61 - 1.24 mg/dL   Calcium 9.5 8.9 - 10.3 mg/dL   Total Protein 7.8 6.5 - 8.1 g/dL   Albumin 3.7 3.5 -  5.0 g/dL   AST 14 (L) 15 - 41 U/L   ALT 8 (L) 17 - 63 U/L   Alkaline Phosphatase 75 38 - 126 U/L   Total Bilirubin 0.8 0.3 - 1.2 mg/dL   GFR calc non Af Amer >60 >60 mL/min   GFR calc Af Amer >60 >60 mL/min    Comment: (NOTE) The eGFR has been calculated using the CKD EPI equation. This calculation has not been validated in all clinical situations. eGFR's persistently <60 mL/min signify possible Chronic Kidney Disease.    Anion gap 13 5 - 15  CBC     Status: Abnormal   Collection Time: 09/07/16 11:54 AM  Result Value Ref Range   WBC 7.3 4.0 - 10.5 K/uL   RBC 5.71 4.22 - 5.81 MIL/uL   Hemoglobin 18.4 (H) 13.0 - 17.0 g/dL   HCT 51.8 39.0 - 52.0 %   MCV 90.7 78.0 - 100.0 fL   MCH 32.2 26.0 - 34.0 pg   MCHC 35.5 30.0 - 36.0 g/dL   RDW 12.7 11.5 - 15.5 %   Platelets 226 150 - 400 K/uL  Urinalysis, Routine w reflex microscopic     Status: Abnormal   Collection Time: 09/07/16 12:35 PM  Result Value Ref Range   Color, Urine  YELLOW YELLOW   APPearance HAZY (A) CLEAR   Specific Gravity, Urine 1.020 1.005 - 1.030   pH 6.5 5.0 - 8.0   Glucose, UA NEGATIVE NEGATIVE mg/dL   Hgb urine dipstick TRACE (A) NEGATIVE   Bilirubin Urine MODERATE (A) NEGATIVE   Ketones, ur >80 (A) NEGATIVE mg/dL   Protein, ur 100 (A) NEGATIVE mg/dL   Nitrite NEGATIVE NEGATIVE   Leukocytes, UA NEGATIVE NEGATIVE  Urinalysis, Microscopic (reflex)     Status: Abnormal   Collection Time: 09/07/16 12:35 PM  Result Value Ref Range   RBC / HPF 6-30 0 - 5 RBC/hpf   WBC, UA NONE SEEN 0 - 5 WBC/hpf   Bacteria, UA FEW (A) NONE SEEN   Squamous Epithelial / LPF NONE SEEN NONE SEEN   Mucous PRESENT    Ct Abdomen Pelvis W Contrast  Result Date: 09/07/2016 CLINICAL DATA:  Nausea and vomiting.  Started last Monday. EXAM: CT ABDOMEN AND PELVIS WITH CONTRAST TECHNIQUE: Multidetector CT imaging of the abdomen and pelvis was performed using the standard protocol following bolus administration of intravenous contrast. CONTRAST:   1 ISOVUE-300 IOPAMIDOL (ISOVUE-300) INJECTION 61% COMPARISON:  None. FINDINGS: Lower chest: No acute abnormality. Hepatobiliary: No focal liver abnormality is seen. No gallstones, gallbladder wall thickening, or biliary dilatation. Pancreas: Unremarkable. No pancreatic ductal dilatation or surrounding inflammatory changes. Spleen: Normal in size without focal abnormality. Adrenals/Urinary Tract: Adrenal glands are unremarkable. Kidneys are normal, without renal calculi, focal lesion, or hydronephrosis. Bladder is unremarkable. Stomach/Bowel: Dilated appendix measuring 11 mm with mucosal enhancement most concerning for acute appendicitis. At the base of the appendix there is mucosal discontinuity along filled with fluid concerning for ruptured appendicitis. 4.2 x 2.7 cm fluid collection in the right lower abdomen most consistent with an abscess. Multiple small bowel air-fluid levels with mild bowel wall thickening concerning for enteritis. No pneumatosis, pneumoperitoneum or portal venous gas. Vascular/Lymphatic: No significant vascular findings are present. No enlarged abdominal or pelvic lymph nodes. Reproductive: Prostate is unremarkable. Other: No abdominal wall hernia or abnormality. No abdominopelvic ascites. Musculoskeletal: No acute or significant osseous findings. IMPRESSION: 1. Findings most consistent with acute ruptured appendicitis. 4.2 x 2.7 cm fluid collection in the right lower abdomen most consistent with an abscess. Moderate amount of pelvic fluid. 2. Multiple small bowel air-fluid levels with mild bowel wall thickening concerning for enteritis secondary to appendiceal rupture. Critical Value/emergent results were called by telephone at the time of interpretation on 09/07/2016 at 3:44 pm to Dr. Quintella Reichert , who verbally acknowledged these results. Electronically Signed   By: Kathreen Devoid   On: 09/07/2016 15:46    Review of Systems  Constitutional: Negative for chills and fever.  HENT:  Negative.   Eyes: Negative.   Respiratory: Negative.   Cardiovascular: Negative.   Gastrointestinal: Positive for abdominal pain, diarrhea and vomiting. Negative for blood in stool.  Genitourinary: Negative.   Musculoskeletal: Negative.   Skin: Negative.   Neurological: Negative.   Psychiatric/Behavioral: Negative.     Blood pressure 119/70, pulse 77, temperature 98 F (36.7 C), temperature source Oral, resp. rate 18, height 6' 2"  (1.88 m), weight 72.6 kg (160 lb), SpO2 99 %. Physical Exam  Constitutional: He appears well-developed and well-nourished.  HENT:  Head: Normocephalic and atraumatic.  Eyes: EOM are normal. Pupils are equal, round, and reactive to light.  Neck: Normal range of motion. Neck supple.  Cardiovascular: Normal rate and regular rhythm.   Respiratory: Effort normal and breath sounds normal.  GI: There is  tenderness.    Neurological: He is alert.  Skin: Skin is warm and dry.  Psychiatric: He has a normal mood and affect. His behavior is normal. Judgment and thought content normal.     Assessment/Plan Perforated appendicitis with periappendiceal abscess  Admit for IV antibiotics. Place on clear liquids for now. Consult image radiology for possible percutaneous drainage. Patient does not need laparotomy at this point since he is nontoxic appearing and has normal white count. I discussed plan of care with him and his mother who is at the bedside. Discussed potential risk of interval appendectomy versus observation depending on his course in the potential need for surgery if he fails percutaneous drainage and medical management. Discussed potential risk of early operative intervention in this case and increased compensation rate in the setting. They do understand if he fails nonoperative management he may require laparotomy.  Amilio Zehnder A., MD 09/07/2016, 4:12 PM

## 2016-09-08 ENCOUNTER — Inpatient Hospital Stay (HOSPITAL_COMMUNITY): Payer: Self-pay

## 2016-09-08 LAB — CBC
HEMATOCRIT: 44.3 % (ref 39.0–52.0)
Hemoglobin: 15.5 g/dL (ref 13.0–17.0)
MCH: 31.7 pg (ref 26.0–34.0)
MCHC: 35 g/dL (ref 30.0–36.0)
MCV: 90.6 fL (ref 78.0–100.0)
Platelets: 215 10*3/uL (ref 150–400)
RBC: 4.89 MIL/uL (ref 4.22–5.81)
RDW: 12.8 % (ref 11.5–15.5)
WBC: 6.3 10*3/uL (ref 4.0–10.5)

## 2016-09-08 LAB — COMPREHENSIVE METABOLIC PANEL
ALBUMIN: 2.8 g/dL — AB (ref 3.5–5.0)
ALT: 10 U/L — AB (ref 17–63)
AST: 12 U/L — AB (ref 15–41)
Alkaline Phosphatase: 68 U/L (ref 38–126)
Anion gap: 11 (ref 5–15)
BUN: 16 mg/dL (ref 6–20)
CO2: 26 mmol/L (ref 22–32)
CREATININE: 0.68 mg/dL (ref 0.61–1.24)
Calcium: 8.6 mg/dL — ABNORMAL LOW (ref 8.9–10.3)
Chloride: 94 mmol/L — ABNORMAL LOW (ref 101–111)
GFR calc Af Amer: >60 mL/min (ref >60)
Glucose, Bld: 145 mg/dL — ABNORMAL HIGH (ref 65–99)
POTASSIUM: 3.5 mmol/L (ref 3.5–5.1)
Sodium: 131 mmol/L — ABNORMAL LOW (ref 135–145)
Total Bilirubin: 0.9 mg/dL (ref 0.3–1.2)
Total Protein: 6.4 g/dL — ABNORMAL LOW (ref 6.5–8.1)

## 2016-09-08 LAB — PROTIME-INR
INR: 1.06
Prothrombin Time: 13.8 seconds (ref 11.4–15.2)

## 2016-09-08 LAB — HIV ANTIBODY (ROUTINE TESTING W REFLEX): HIV SCREEN 4TH GENERATION: NONREACTIVE

## 2016-09-08 MED ORDER — MIDAZOLAM HCL 2 MG/2ML IJ SOLN
INTRAMUSCULAR | Status: AC
  Filled 2016-09-08: qty 4

## 2016-09-08 MED ORDER — LIDOCAINE HCL 1 % IJ SOLN
INTRAMUSCULAR | Status: AC
  Filled 2016-09-08: qty 20

## 2016-09-08 MED ORDER — PROMETHAZINE HCL 25 MG/ML IJ SOLN
25.0000 mg | INTRAMUSCULAR | Status: DC | PRN
  Administered 2016-09-08 – 2016-09-11 (×4): 25 mg via INTRAVENOUS
  Filled 2016-09-08 (×5): qty 1

## 2016-09-08 MED ORDER — ALUM & MAG HYDROXIDE-SIMETH 200-200-20 MG/5ML PO SUSP
30.0000 mL | ORAL | Status: DC | PRN
  Administered 2016-09-08 – 2016-09-09 (×2): 30 mL via ORAL
  Filled 2016-09-08 (×2): qty 30

## 2016-09-08 MED ORDER — FENTANYL CITRATE (PF) 100 MCG/2ML IJ SOLN
INTRAMUSCULAR | Status: AC | PRN
  Administered 2016-09-08 (×3): 50 ug via INTRAVENOUS

## 2016-09-08 MED ORDER — MIDAZOLAM HCL 2 MG/2ML IJ SOLN
INTRAMUSCULAR | Status: AC | PRN
Start: 2016-09-08 — End: 2016-09-08
  Administered 2016-09-08 (×2): 1 mg via INTRAVENOUS

## 2016-09-08 MED ORDER — FENTANYL CITRATE (PF) 100 MCG/2ML IJ SOLN
INTRAMUSCULAR | Status: AC
  Filled 2016-09-08: qty 4

## 2016-09-08 NOTE — Sedation Documentation (Signed)
Patient denies pain and is resting comfortably.  

## 2016-09-08 NOTE — Consult Note (Signed)
Chief Complaint: Patient was seen in consultation today for CT guided aspiration/possible drainage of pelvic fluid collection/abscess Chief Complaint  Patient presents with  . Abdominal Pain  . Nausea    Referring Physician(s): Cornett,T  Supervising Physician: Oley Balm  Patient Status: [facility] - In-pt  History of Present Illness: Dakota Goodman is a 26 y.o. male with no sig PMH who was admitted to [facility] 3/10 with 1 week hx of abd pain,N/V. Subsequent imaging revealed : Findings most consistent with acute ruptured appendicitis. 4.2 x 2.7 cm fluid collection in the right lower abdomen most consistent with an abscess. Moderate amount of pelvic fluid. 2. Multiple small bowel air-fluid levels with mild bowel wall thickening concerning for enteritis secondary to appendiceal rupture.  Request now received from [facility] for CT guided aspiration/possible drainage of the pelvic fluid collection/abscess. He is currently afebrile with nl WBC.  History reviewed. No pertinent past medical history.  History reviewed. No pertinent surgical history.  Allergies: Sulfa drugs cross reactors  Medications: Prior to Admission medications   Medication Sig Start Date End Date Taking? Authorizing Provider  b complex vitamins tablet Take 1 tablet by mouth daily.   Yes Historical Provider, MD  ibuprofen (ADVIL,MOTRIN) 200 MG tablet Take 600 mg by mouth every 6 (six) hours as needed. For pain   Yes Historical Provider, MD  Prenatal Vit-Fe Fumarate-FA (PRENATAL MULTIVITAMIN) TABS Take 1 tablet by mouth daily.   Yes Historical Provider, MD  vitamin C (ASCORBIC ACID) 500 MG tablet Take 500 mg by mouth daily.   Yes Historical Provider, MD     No family history on file.  Social History   Social History  . Marital status: Single    Spouse name: N/A  . Number of children: N/A  . Years of education: N/A   Social History Main Topics  . Smoking status: Never Smoker  . Smokeless tobacco: Never  Used  . Alcohol use Yes  . Drug use: Unknown  . Sexual activity: Not Asked   Other Topics Concern  . None   Social History Narrative  . None      Review of Systems currently denies fever, HA,CP,dyspnea, cough, worsening abd/back pain, or vomiting. He does have some reflux and nausea.  Vital Signs: BP 129/69 (BP Location: Left Arm)   Pulse 70   Temp 98.2 F (36.8 C) (Oral)   Resp 18   Ht 6\' 3"  (1.905 m)   Wt 151 lb 9.6 oz (68.8 kg)   SpO2 97%   BMI 18.95 kg/m   Physical Exam awake/alert; chest- CTA bilat; heart- RRR; abd- soft,+BS,not sig tender; ext- no edema  Mallampati Score:     Imaging: Ct Abdomen Pelvis W Contrast  Result Date: 09/07/2016 CLINICAL DATA:  Nausea and vomiting.  Started last Monday. EXAM: CT ABDOMEN AND PELVIS WITH CONTRAST TECHNIQUE: Multidetector CT imaging of the abdomen and pelvis was performed using the standard protocol following bolus administration of intravenous contrast. CONTRAST:  1 ISOVUE-300 IOPAMIDOL (ISOVUE-300) INJECTION 61% COMPARISON:  None. FINDINGS: Lower chest: No acute abnormality. Hepatobiliary: No focal liver abnormality is seen. No gallstones, gallbladder wall thickening, or biliary dilatation. Pancreas: Unremarkable. No pancreatic ductal dilatation or surrounding inflammatory changes. Spleen: Normal in size without focal abnormality. Adrenals/Urinary Tract: Adrenal glands are unremarkable. Kidneys are normal, without renal calculi, focal lesion, or hydronephrosis. Bladder is unremarkable. Stomach/Bowel: Dilated appendix measuring 11 mm with mucosal enhancement most concerning for acute appendicitis. At the base of the appendix there is mucosal discontinuity  along filled with fluid concerning for ruptured appendicitis. 4.2 x 2.7 cm fluid collection in the right lower abdomen most consistent with an abscess. Multiple small bowel air-fluid levels with mild bowel wall thickening concerning for enteritis. No pneumatosis, pneumoperitoneum or  portal venous gas. Vascular/Lymphatic: No significant vascular findings are present. No enlarged abdominal or pelvic lymph nodes. Reproductive: Prostate is unremarkable. Other: No abdominal wall hernia or abnormality. No abdominopelvic ascites. Musculoskeletal: No acute or significant osseous findings. IMPRESSION: 1. Findings most consistent with acute ruptured appendicitis. 4.2 x 2.7 cm fluid collection in the right lower abdomen most consistent with an abscess. Moderate amount of pelvic fluid. 2. Multiple small bowel air-fluid levels with mild bowel wall thickening concerning for enteritis secondary to appendiceal rupture. Critical Value/emergent results were called by telephone at the time of interpretation on 09/07/2016 at 3:44 pm to Dr. Tilden FossaELIZABETH REES , who verbally acknowledged these results. Electronically Signed   By: Elige KoHetal  Patel   On: 09/07/2016 15:46    Labs:  CBC:  Recent Labs  09/07/16 1154 09/08/16 0446  WBC 7.3 6.3  HGB 18.4* 15.5  HCT 51.8 44.3  PLT 226 215    COAGS:  Recent Labs  09/08/16 0446  INR 1.06    BMP:  Recent Labs  09/07/16 1154 09/08/16 0446  NA 132* 131*  K 3.9 3.5  CL 89* 94*  CO2 30 26  GLUCOSE 120* 145*  BUN 18 16  CALCIUM 9.5 8.6*  CREATININE 0.82 0.68  GFRNONAA >60 >60  GFRAA >60 >60    LIVER FUNCTION TESTS:  Recent Labs  09/07/16 1154 09/08/16 0446  BILITOT 0.8 0.9  AST 14* 12*  ALT 8* 10*  ALKPHOS 75 68  PROT 7.8 6.4*  ALBUMIN 3.7 2.8*    TUMOR MARKERS: No results for input(s): AFPTM, CEA, CA199, CHROMGRNA in the last 8760 hours.  Assessment and Plan: Pt with week long hx abd pain,N/V, imaging findings c/w acute ruptured appendicitis with assoc fluid collection/abscess, mod free pelvic fluid, enteritis; WBC nl, currently afebrile; request received for CT guided aspiration/possible drainage of pelvic fluid collection/abscess. Imaging studies have been reviewed by Dr. Deanne CofferHassell. Plan is for repeat CT today and if accessible  window will plan asp/poss drain placement. Risks and benefits discussed with the patient including bleeding, infection, damage to adjacent structures, bowel perforation/fistula connection, and sepsis.All of the patient's questions were answered, patient is agreeable to proceed.Consent signed and in chart.     Thank you for this interesting consult.  I greatly enjoyed meeting [employer] Ryan Whitis and look forward to participating in their care.  A copy of this report was sent to the requesting provider on this date.  Electronically Signed: D. Jeananne RamaKevin Leldon Steege 09/08/2016, 8:33 AM   I spent a total of 25 minutes  in face to face in clinical consultation, greater than 50% of which was counseling/coordinating care for CT guided aspiration/possible drainage of pelvic fluid collection/abscess

## 2016-09-08 NOTE — Procedures (Signed)
CT pelvic abscess drain catheter placement 35ml blood tinged fluid with debris aspirated, sample for GS, C&S No complication No blood loss. [facility].

## 2016-09-08 NOTE — Progress Notes (Signed)
Subjective: Doing well. Stable and alert Has some abdominal pain and nausea. Getting ready to go to radiology for drainage procedure Afebrile.  CBC 6300.  Hemoglobin 15.5.  Creatinine 0.68.  Objective: Vital signs in last 24 hours: Temp:  [97.9 F (36.6 C)-98.2 F (36.8 C)] 98.2 F (36.8 C) (03/11 0528) Pulse Rate:  [70-100] 70 (03/11 0528) Resp:  [16-20] 18 (03/11 0528) BP: (110-130)/(61-91) 129/69 (03/11 0528) SpO2:  [97 %-100 %] 97 % (03/11 0528) Weight:  [68.8 kg (151 lb 9.6 oz)-72.6 kg (160 lb)] 68.8 kg (151 lb 9.6 oz) (03/10 1735) Last BM Date: 09/07/16  Intake/Output from previous day: 03/10 0701 - 03/11 0700 In: 1560 [P.O.:510; IV Piggyback:1050] Out: 725 [Urine:725] Intake/Output this shift: Total I/O In: 0  Out: 140 [Urine:140]  General appearance: Alert.  Cooperative.  Mild distress. Resp: clear to auscultation bilaterally GI: Soft.  Nondistended.  Tender across the suprapubic area.  No mass. Extremities: no edema, redness or tenderness in the calves or thighs  Lab Results:   Recent Labs  09/07/16 1154 09/08/16 0446  WBC 7.3 6.3  HGB 18.4* 15.5  HCT 51.8 44.3  PLT 226 215   BMET  Recent Labs  09/07/16 1154 09/08/16 0446  NA 132* 131*  K 3.9 3.5  CL 89* 94*  CO2 30 26  GLUCOSE 120* 145*  BUN 18 16  CREATININE 0.82 0.68  CALCIUM 9.5 8.6*   PT/INR  Recent Labs  09/08/16 0446  LABPROT 13.8  INR 1.06   ABG No results for input(s): PHART, HCO3 in the last 72 hours.  Invalid input(s): PCO2, PO2  Studies/Results: Ct Abdomen Pelvis W Contrast  Result Date: 09/07/2016 CLINICAL DATA:  Nausea and vomiting.  Started last Monday. EXAM: CT ABDOMEN AND PELVIS WITH CONTRAST TECHNIQUE: Multidetector CT imaging of the abdomen and pelvis was performed using the standard protocol following bolus administration of intravenous contrast. CONTRAST:  1 ISOVUE-300 IOPAMIDOL (ISOVUE-300) INJECTION 61% COMPARISON:  None. FINDINGS: Lower chest: No acute  abnormality. Hepatobiliary: No focal liver abnormality is seen. No gallstones, gallbladder wall thickening, or biliary dilatation. Pancreas: Unremarkable. No pancreatic ductal dilatation or surrounding inflammatory changes. Spleen: Normal in size without focal abnormality. Adrenals/Urinary Tract: Adrenal glands are unremarkable. Kidneys are normal, without renal calculi, focal lesion, or hydronephrosis. Bladder is unremarkable. Stomach/Bowel: Dilated appendix measuring 11 mm with mucosal enhancement most concerning for acute appendicitis. At the base of the appendix there is mucosal discontinuity along filled with fluid concerning for ruptured appendicitis. 4.2 x 2.7 cm fluid collection in the right lower abdomen most consistent with an abscess. Multiple small bowel air-fluid levels with mild bowel wall thickening concerning for enteritis. No pneumatosis, pneumoperitoneum or portal venous gas. Vascular/Lymphatic: No significant vascular findings are present. No enlarged abdominal or pelvic lymph nodes. Reproductive: Prostate is unremarkable. Other: No abdominal wall hernia or abnormality. No abdominopelvic ascites. Musculoskeletal: No acute or significant osseous findings. IMPRESSION: 1. Findings most consistent with acute ruptured appendicitis. 4.2 x 2.7 cm fluid collection in the right lower abdomen most consistent with an abscess. Moderate amount of pelvic fluid. 2. Multiple small bowel air-fluid levels with mild bowel wall thickening concerning for enteritis secondary to appendiceal rupture. Critical Value/emergent results were called by telephone at the time of interpretation on 09/07/2016 at 3:44 pm to Dr. Tilden Fossa , who verbally acknowledged these results. Electronically Signed   By: Elige Ko   On: 09/07/2016 15:46    Anti-infectives: Anti-infectives    Start     Dose/Rate Route  Frequency Ordered Stop   09/07/16 2200  piperacillin-tazobactam (ZOSYN) IVPB 3.375 g     3.375 g 12.5 mL/hr over  240 Minutes Intravenous Every 8 hours 09/07/16 1623     09/07/16 1600  piperacillin-tazobactam (ZOSYN) IVPB 3.375 g     3.375 g 100 mL/hr over 30 Minutes Intravenous  Once 09/07/16 1548 09/07/16 1658      Assessment/Plan:   Perforated appendicitis with abscess. Continue IV Zosyn Percutaneous drainage of periappendiceal abscess in IR  Today. Treatment plan was explained to him in detail.   LOS: 1 day    Dakota Goodman,Dakota Goodman 09/08/2016

## 2016-09-09 ENCOUNTER — Encounter (HOSPITAL_COMMUNITY): Payer: Self-pay | Admitting: General Practice

## 2016-09-09 LAB — BASIC METABOLIC PANEL
ANION GAP: 6 (ref 5–15)
BUN: 10 mg/dL (ref 6–20)
CHLORIDE: 100 mmol/L — AB (ref 101–111)
CO2: 29 mmol/L (ref 22–32)
Calcium: 8 mg/dL — ABNORMAL LOW (ref 8.9–10.3)
Creatinine, Ser: 0.63 mg/dL (ref 0.61–1.24)
GFR calc Af Amer: >60 mL/min (ref >60)
GFR calc non Af Amer: >60 mL/min (ref >60)
GLUCOSE: 118 mg/dL — AB (ref 65–99)
POTASSIUM: 3.4 mmol/L — AB (ref 3.5–5.1)
Sodium: 135 mmol/L (ref 135–145)

## 2016-09-09 LAB — CBC
HEMATOCRIT: 39.3 % (ref 39.0–52.0)
Hemoglobin: 13.5 g/dL (ref 13.0–17.0)
MCH: 32 pg (ref 26.0–34.0)
MCHC: 34.4 g/dL (ref 30.0–36.0)
MCV: 93.1 fL (ref 78.0–100.0)
PLATELETS: 198 10*3/uL (ref 150–400)
RBC: 4.22 MIL/uL (ref 4.22–5.81)
RDW: 13.4 % (ref 11.5–15.5)
WBC: 4.5 10*3/uL (ref 4.0–10.5)

## 2016-09-09 NOTE — Progress Notes (Signed)
[facility] Progress Note     Subjective: Pt states pain after BM's that last for about an hour then goes away. Pain is in the suprapubic region. Tolerating clears, no fever overnight. No pain currently. Numerous nonbloody watery BM's.   Objective: Vital signs in last 24 hours: Temp:  [98.6 F (37 C)-99.1 F (37.3 C)] 98.6 F (37 C) (03/11 2056) Pulse Rate:  [73-85] 80 (03/11 2056) Resp:  [16-22] 18 (03/11 2056) BP: (111-125)/(51-80) 123/65 (03/11 2056) SpO2:  [97 %-100 %] 97 % (03/11 2056) Last BM Date: 09/06/16  Intake/Output from previous day: 03/11 0701 - 03/12 0700 In: 2620 [I.V.:2500; IV Piggyback:100] Out: 1170 [Urine:1050; Drains:120] Intake/Output this shift: No intake/output data recorded.  PE: Gen:  Alert, NAD, pleasant, cooperative, well appearing Card:  RRR, no M/G/R heard Pulm:  CTA, no W/R/R, effort normal Abd: Soft, nondistended, +BS, drain site C/D/I, drain with minimal serosanguinous with scant purulent drainage, mild TTP in suprapubic region Skin: no rashes noted, warm and dry, not diaphoretic  Lab Results:   Recent Labs  09/07/16 1154 09/08/16 0446  WBC 7.3 6.3  HGB 18.4* 15.5  HCT 51.8 44.3  PLT 226 215   BMET  Recent Labs  09/07/16 1154 09/08/16 0446  NA 132* 131*  K 3.9 3.5  CL 89* 94*  CO2 30 26  GLUCOSE 120* 145*  BUN 18 16  CREATININE 0.82 0.68  CALCIUM 9.5 8.6*   PT/INR  Recent Labs  09/08/16 0446  LABPROT 13.8  INR 1.06   CMP     Component Value Date/Time   NA 131 (L) 09/08/2016 0446   K 3.5 09/08/2016 0446   CL 94 (L) 09/08/2016 0446   CO2 26 09/08/2016 0446   GLUCOSE 145 (H) 09/08/2016 0446   BUN 16 09/08/2016 0446   CREATININE 0.68 09/08/2016 0446   CALCIUM 8.6 (L) 09/08/2016 0446   PROT 6.4 (L) 09/08/2016 0446   ALBUMIN 2.8 (L) 09/08/2016 0446   AST 12 (L) 09/08/2016 0446   ALT 10 (L) 09/08/2016 0446   ALKPHOS 68 09/08/2016 0446   BILITOT 0.9 09/08/2016 0446   GFRNONAA >60 09/08/2016 0446    GFRAA >60 09/08/2016 0446   Lipase     Component Value Date/Time   LIPASE <10 (L) 09/07/2016 1154       Studies/Results: Ct Abdomen Pelvis W Contrast  Result Date: 09/07/2016 CLINICAL DATA:  Nausea and vomiting.  Started last Monday. EXAM: CT ABDOMEN AND PELVIS WITH CONTRAST TECHNIQUE: Multidetector CT imaging of the abdomen and pelvis was performed using the standard protocol following bolus administration of intravenous contrast. CONTRAST:  1 ISOVUE-300 IOPAMIDOL (ISOVUE-300) INJECTION 61% COMPARISON:  None. FINDINGS: Lower chest: No acute abnormality. Hepatobiliary: No focal liver abnormality is seen. No gallstones, gallbladder wall thickening, or biliary dilatation. Pancreas: Unremarkable. No pancreatic ductal dilatation or surrounding inflammatory changes. Spleen: Normal in size without focal abnormality. Adrenals/Urinary Tract: Adrenal glands are unremarkable. Kidneys are normal, without renal calculi, focal lesion, or hydronephrosis. Bladder is unremarkable. Stomach/Bowel: Dilated appendix measuring 11 mm with mucosal enhancement most concerning for acute appendicitis. At the base of the appendix there is mucosal discontinuity along filled with fluid concerning for ruptured appendicitis. 4.2 x 2.7 cm fluid collection in the right lower abdomen most consistent with an abscess. Multiple small bowel air-fluid levels with mild bowel wall thickening concerning for enteritis. No pneumatosis, pneumoperitoneum or portal venous gas. Vascular/Lymphatic: No significant vascular findings are present. No enlarged abdominal or pelvic lymph nodes. Reproductive: Prostate is  unremarkable. Other: No abdominal wall hernia or abnormality. No abdominopelvic ascites. Musculoskeletal: No acute or significant osseous findings. IMPRESSION: 1. Findings most consistent with acute ruptured appendicitis. 4.2 x 2.7 cm fluid collection in the right lower abdomen most consistent with an abscess. Moderate amount of pelvic  fluid. 2. Multiple small bowel air-fluid levels with mild bowel wall thickening concerning for enteritis secondary to appendiceal rupture. Critical Value/emergent results were called by telephone at the time of interpretation on 09/07/2016 at 3:44 pm to Dr. Tilden FossaELIZABETH REES , who verbally acknowledged these results. Electronically Signed   By: Elige KoHetal  Patel   On: 09/07/2016 15:46   Ct Image Guided Drainage By Percutaneous Catheter  Result Date: 09/08/2016 CLINICAL DATA:  Appendicitis with peritoneal fluid collection EXAM: CT GUIDED DRAINAGE OF PELVIC ABSCESS ANESTHESIA/SEDATION: Intravenous Fentanyl and Versed were administered as conscious sedation during continuous monitoring of the patient's level of consciousness and physiological / cardiorespiratory status by the radiology RN, with a total moderate sedation time of 12 minutes. PROCEDURE: The procedure, risks, benefits, and alternatives were explained to the patient. Questions regarding the procedure were encouraged and answered. The patient understands and consents to the procedure. Initially limited axial scans through the mid abdomen obtained with patient supine. A small periappendiceal collection was localized but due to the small size of the collection, and multiple overlying and adjacent loops of small bowel, percutaneous drainage was not thought reasonably feasible. Patient placed prone. Select axial scans through the pelvis were obtained. Persistent loculated cul-de-sac fluid collection was confirmed. Appropriate skin entry site was determined and marked. The operative field was prepped with chlorhexidinein a sterile fashion, and a sterile drape was applied covering the operative field. A sterile gown and sterile gloves were used for the procedure. Local anesthesia was provided with 1% Lidocaine. Under CT fluoroscopic guidance, a 18 gauge percutaneous entry needle was advanced into the collection using a left trans gluteal approach. Cloudy blood-tinged  fluid could be aspirated. An Amplatz guidewire advanced easily within the collection, its position confirmed on CT. Tract was dilated to facilitate placement of a 12 French pigtail catheter, formed centrally within the collection. A sample of the aspirate sent for Gram stain, culture and sensitivity. Catheter secured externally with 0 Prolene suture and StatLock and placed to gravity drain bag. The patient tolerated the procedure well. COMPLICATIONS: None immediate FINDINGS: Small periappendiceal collection, not amenable to percutaneous drainage. Loculated larger cul-de-sac fluid collection. 12-French pigtail drain catheter placed. Sample of the aspirate sent for Gram stain, culture and sensitivity. IMPRESSION: 1. Technically successful CT-guided pelvic abscess drain catheter placement. Electronically Signed   By: Corlis Leak  Hassell M.D.   On: 09/08/2016 12:48    Anti-infectives: Anti-infectives    Start     Dose/Rate Route Frequency Ordered Stop   09/07/16 2200  piperacillin-tazobactam (ZOSYN) IVPB 3.375 g     3.375 g 12.5 mL/hr over 240 Minutes Intravenous Every 8 hours 09/07/16 1623     09/07/16 1600  piperacillin-tazobactam (ZOSYN) IVPB 3.375 g     3.375 g 100 mL/hr over 30 Minutes Intravenous  Once 09/07/16 1548 09/07/16 1658       Assessment/Plan  Perforated appendicitis with abscess - IR placed drain yesterday - AM labs pending - afebrile - had numerous nonbloody BM's  FEN: clear liquids [id number]:[id number]:lovenox ID: Zosyn   Plan: Non operative management. If this fails pt will need laparotomy.     LOS: 2 days    Jerre SimonJessica L Focht , [facility] 09/09/2016, 8:08 AM Pager:  [phone] Consults: [phone] Mon-Fri 7:00 am-4:30 pm Sat-Sun 7:00 am-11:30 am

## 2016-09-09 NOTE — Care Management Note (Signed)
Case Management Note  Patient Details  Name: Autumn Pattyravis Ryan Uram MRN: [id number] Date of Birth: 1990-07-14  Subjective/Objective:                    Action/Plan:  Perforated appendicitis with abscess. Continue IV Zosyn Percutaneous drainage of periappendiceal abscess in IR   NCM can assist with MATCH letter on day of discharge Expected Discharge Date:                  Expected Discharge Plan:  Home/Self Care  In-House Referral:     Discharge planning Services  CM Consult, Indigent Health Clinic, Medication Assistance, [facility]  Post Acute Care Choice:    Choice offered to:     DME Arranged:    DME Agency:     HH Arranged:    HH Agency:     Status of Service:  In process, will continue to follow  If discussed at Long Length of Stay Meetings, dates discussed:    Additional Comments:  [name], Cam Harnden Marie, RN 09/09/2016, 11:04 AM

## 2016-09-10 LAB — CBC
HCT: 39.8 % (ref 39.0–52.0)
Hemoglobin: 13.5 g/dL (ref 13.0–17.0)
MCH: 31.3 pg (ref 26.0–34.0)
MCHC: 33.9 g/dL (ref 30.0–36.0)
MCV: 92.1 fL (ref 78.0–100.0)
PLATELETS: 211 10*3/uL (ref 150–400)
RBC: 4.32 MIL/uL (ref 4.22–5.81)
RDW: 13 % (ref 11.5–15.5)
WBC: 6.2 10*3/uL (ref 4.0–10.5)

## 2016-09-10 LAB — BASIC METABOLIC PANEL
Anion gap: 8 (ref 5–15)
BUN: <5 mg/dL — ABNORMAL LOW (ref 6–20)
CALCIUM: 8 mg/dL — AB (ref 8.9–10.3)
CO2: 26 mmol/L (ref 22–32)
CREATININE: 0.58 mg/dL — AB (ref 0.61–1.24)
Chloride: 100 mmol/L — ABNORMAL LOW (ref 101–111)
GFR calc non Af Amer: >60 mL/min (ref >60)
Glucose, Bld: 109 mg/dL — ABNORMAL HIGH (ref 65–99)
Potassium: 3.7 mmol/L (ref 3.5–5.1)
Sodium: 134 mmol/L — ABNORMAL LOW (ref 135–145)

## 2016-09-10 MED ORDER — AMOXICILLIN-POT CLAVULANATE 875-125 MG PO TABS
1.0000 | ORAL_TABLET | Freq: Two times a day (BID) | ORAL | Status: DC
  Administered 2016-09-10 – 2016-09-11 (×3): 1 via ORAL
  Filled 2016-09-10 (×3): qty 1

## 2016-09-10 MED ORDER — IBUPROFEN 600 MG PO TABS: 600.0000 mg | ORAL_TABLET | Freq: Four times a day (QID) | ORAL | Status: DC | PRN

## 2016-09-10 MED ORDER — HYDROMORPHONE HCL 2 MG/ML IJ SOLN
0.5000 mg | INTRAMUSCULAR | Status: DC | PRN
  Administered 2016-09-10 – 2016-09-11 (×2): 0.5 mg via INTRAVENOUS
  Filled 2016-09-10 (×2): qty 1

## 2016-09-10 MED ORDER — OXYCODONE HCL 5 MG PO TABS
5.0000 mg | ORAL_TABLET | ORAL | Status: DC | PRN
  Administered 2016-09-10 – 2016-09-11 (×5): 10 mg via ORAL
  Filled 2016-09-10 (×5): qty 2

## 2016-09-10 MED ORDER — SACCHAROMYCES BOULARDII 250 MG PO CAPS
250.0000 mg | ORAL_CAPSULE | Freq: Two times a day (BID) | ORAL | Status: DC
  Administered 2016-09-10 – 2016-09-11 (×3): 250 mg via ORAL
  Filled 2016-09-10 (×3): qty 1

## 2016-09-10 NOTE — Progress Notes (Signed)
[facility] Progress Note     Subjective: Pt states he is not eating much. He is having significant nausea. He is still having diarrhea. Pain is a little better than yesterday. Had a lot of abdominal cramping overnight that has resolved. No vomiting, fever or chills.   Objective: Vital signs in last 24 hours: Temp:  [97.9 F (36.6 C)-99.3 F (37.4 C)] 99.3 F (37.4 C) (03/13 0530) Pulse Rate:  [83-85] 84 (03/13 0530) Resp:  [18] 18 (03/13 0530) BP: (109-116)/(66-70) 109/66 (03/13 0530) SpO2:  [98 %] 98 % (03/13 0530) Last BM Date: 09/09/16  Intake/Output from previous day: 03/12 0701 - 03/13 0700 In: 2538.3 [P.O.:1180; I.V.:1343.3] Out: 1790 [Urine:1700; Drains:90] Intake/Output this shift: No intake/output data recorded.  PE: Gen:  Alert, NAD, pleasant, cooperative, well appearing Card:  RRR, no M/G/R heard Pulm:  CTA, no W/R/R, effort normal Abd: Soft, not distended, +BS, drain site C/D/I, drain with minimal serosanguinous mixed with scant purulent drainage, mild generalized TTP 4/10 Skin: no rashes noted, warm and dry, not diaphoretic  Lab Results:   Recent Labs  09/08/16 0446 09/09/16 0822  WBC 6.3 4.5  HGB 15.5 13.5  HCT 44.3 39.3  PLT 215 198   BMET  Recent Labs  09/08/16 0446 09/09/16 0822  NA 131* 135  K 3.5 3.4*  CL 94* 100*  CO2 26 29  GLUCOSE 145* 118*  BUN 16 10  CREATININE 0.68 0.63  CALCIUM 8.6* 8.0*   PT/INR  Recent Labs  09/08/16 0446  LABPROT 13.8  INR 1.06   CMP     Component Value Date/Time   NA 135 09/09/2016 0822   K 3.4 (L) 09/09/2016 0822   CL 100 (L) 09/09/2016 0822   CO2 29 09/09/2016 0822   GLUCOSE 118 (H) 09/09/2016 0822   BUN 10 09/09/2016 0822   CREATININE 0.63 09/09/2016 0822   CALCIUM 8.0 (L) 09/09/2016 0822   PROT 6.4 (L) 09/08/2016 0446   ALBUMIN 2.8 (L) 09/08/2016 0446   AST 12 (L) 09/08/2016 0446   ALT 10 (L) 09/08/2016 0446   ALKPHOS 68 09/08/2016 0446   BILITOT 0.9 09/08/2016 0446    GFRNONAA >60 09/09/2016 0822   GFRAA >60 09/09/2016 0822   Lipase     Component Value Date/Time   LIPASE <10 (L) 09/07/2016 1154       Studies/Results: Ct Image Guided Drainage By Percutaneous Catheter  Result Date: 09/08/2016 CLINICAL DATA:  Appendicitis with peritoneal fluid collection EXAM: CT GUIDED DRAINAGE OF PELVIC ABSCESS ANESTHESIA/SEDATION: Intravenous Fentanyl and Versed were administered as conscious sedation during continuous monitoring of the patient's level of consciousness and physiological / cardiorespiratory status by the radiology RN, with a total moderate sedation time of 12 minutes. PROCEDURE: The procedure, risks, benefits, and alternatives were explained to the patient. Questions regarding the procedure were encouraged and answered. The patient understands and consents to the procedure. Initially limited axial scans through the mid abdomen obtained with patient supine. A small periappendiceal collection was localized but due to the small size of the collection, and multiple overlying and adjacent loops of small bowel, percutaneous drainage was not thought reasonably feasible. Patient placed prone. Select axial scans through the pelvis were obtained. Persistent loculated cul-de-sac fluid collection was confirmed. Appropriate skin entry site was determined and marked. The operative field was prepped with chlorhexidinein a sterile fashion, and a sterile drape was applied covering the operative field. A sterile gown and sterile gloves were used for the procedure. Local anesthesia was  provided with 1% Lidocaine. Under CT fluoroscopic guidance, a 18 gauge percutaneous entry needle was advanced into the collection using a left trans gluteal approach. Cloudy blood-tinged fluid could be aspirated. An Amplatz guidewire advanced easily within the collection, its position confirmed on CT. Tract was dilated to facilitate placement of a 12 French pigtail catheter, formed centrally within the  collection. A sample of the aspirate sent for Gram stain, culture and sensitivity. Catheter secured externally with 0 Prolene suture and StatLock and placed to gravity drain bag. The patient tolerated the procedure well. COMPLICATIONS: None immediate FINDINGS: Small periappendiceal collection, not amenable to percutaneous drainage. Loculated larger cul-de-sac fluid collection. 12-French pigtail drain catheter placed. Sample of the aspirate sent for Gram stain, culture and sensitivity. IMPRESSION: 1. Technically successful CT-guided pelvic abscess drain catheter placement. Electronically Signed   By: Corlis Leak M.D.   On: 09/08/2016 12:48    Anti-infectives: Anti-infectives    Start     Dose/Rate Route Frequency Ordered Stop   09/07/16 2200  piperacillin-tazobactam (ZOSYN) IVPB 3.375 g     3.375 g 12.5 mL/hr over 240 Minutes Intravenous Every 8 hours 09/07/16 1623     09/07/16 1600  piperacillin-tazobactam (ZOSYN) IVPB 3.375 g     3.375 g 100 mL/hr over 30 Minutes Intravenous  Once 09/07/16 1548 09/07/16 1658       Assessment/Plan Perforated appendicitis with abscess - IR placed drain  - AM labs pending - afebrile - had numerous nonbloody BM's  FEN: soft diet [id number]:[id number] ID: Zosyn 3/10>>  Plan: Non operative management. If this fails pt will need laparotomy. Pt has been having nausea. Switched to PO pain meds. Perhaps the dilaudid is contributing to his nausea. He has not eaten much. I discussed with him that in order for him to be discharged he has to tolerate food. Could dc on PO antibiotics if pain is controlled and he is eating.   LOS: 3 days    Jerre Simon , [facility] 09/10/2016, 8:48 AM Pager: [phone] Consults: [phone] Mon-Fri 7:00 am-4:30 pm Sat-Sun 7:00 am-11:30 am

## 2016-09-10 NOTE — Progress Notes (Signed)
Patient complained of feeling warm, Temp checked 98.1.will monitor accordingly.

## 2016-09-11 ENCOUNTER — Encounter: Payer: Self-pay | Admitting: General Surgery

## 2016-09-11 MED ORDER — ACETAMINOPHEN 325 MG PO TABS: ORAL_TABLET | ORAL | Status: DC

## 2016-09-11 MED ORDER — METRONIDAZOLE 500 MG PO TABS: 500.0000 mg | ORAL_TABLET | Freq: Three times a day (TID) | ORAL | 0 refills | Status: DC

## 2016-09-11 MED ORDER — SACCHAROMYCES BOULARDII 250 MG PO CAPS
ORAL_CAPSULE | ORAL | Status: DC
Start: 2016-09-11 — End: 2017-01-26

## 2016-09-11 MED ORDER — AMOXICILLIN-POT CLAVULANATE 875-125 MG PO TABS: ORAL_TABLET | ORAL | 0 refills | Status: DC

## 2016-09-11 MED ORDER — IBUPROFEN 200 MG PO TABS: ORAL_TABLET | ORAL | 0 refills | Status: DC

## 2016-09-11 MED ORDER — METRONIDAZOLE 500 MG PO TABS: 500.0000 mg | ORAL_TABLET | Freq: Three times a day (TID) | ORAL | 0 refills | Status: AC

## 2016-09-11 MED ORDER — OXYCODONE-ACETAMINOPHEN 5-325 MG PO TABS: 1.0000 | ORAL_TABLET | ORAL | 0 refills | Status: DC | PRN

## 2016-09-11 MED ORDER — OXYCODONE-ACETAMINOPHEN 5-325 MG PO TABS
1.0000 | ORAL_TABLET | ORAL | Status: DC | PRN
  Administered 2016-09-11: 2 via ORAL
  Filled 2016-09-11: qty 2

## 2016-09-11 MED ORDER — AMOXICILLIN-POT CLAVULANATE 875-125 MG PO TABS: ORAL_TABLET | ORAL | 1 refills | Status: DC

## 2016-09-11 MED ORDER — CIPROFLOXACIN HCL 500 MG PO TABS: 500.0000 mg | ORAL_TABLET | Freq: Two times a day (BID) | ORAL | 0 refills | Status: DC

## 2016-09-11 MED ORDER — ONDANSETRON 4 MG PO TBDP: 4.0000 mg | ORAL_TABLET | Freq: Four times a day (QID) | ORAL | 0 refills | Status: DC | PRN

## 2016-09-11 MED ORDER — CIPROFLOXACIN HCL 500 MG PO TABS: 500.0000 mg | ORAL_TABLET | Freq: Two times a day (BID) | ORAL | 0 refills | Status: AC

## 2016-09-11 NOTE — Progress Notes (Signed)
   09/11/16 1300  Clinical Encounter Type  Visited With Patient  Visit Type Initial  Consult/Referral To Chaplain  Spiritual Encounters  Spiritual Needs Emotional  Stress Factors  Patient Stress Factors Exhausted;Health changes    Chaplain provided emotional support and ministry of presence.

## 2016-09-11 NOTE — Progress Notes (Signed)
Pt discharged home with his sister after going over discharge instructions with him and his sister,no concerns voiced. MATCH letter, AVS , discharge script and saline flushes provided before leaving the unit. Pt and his sister also educated on how to flush the drain.

## 2016-09-11 NOTE — Progress Notes (Signed)
Referring Physician(s): Dr Edythe Lynn  Supervising Physician: Simonne Come  Patient Status:  [facility] - In-pt  Chief Complaint:  Ruptured appendix Pelvic abscess drain placement 09/08/2016  Subjective:  Pt is without pain Denies N/V Drain intact since 3/11 Output diminishing + pseudomonas and Citrobacter Wbc now 6.2 On Augmentin     Allergies: Sulfa drugs cross reactors  Medications: Prior to Admission medications   Medication Sig Start Date End Date Taking? Authorizing Provider  b complex vitamins tablet Take 1 tablet by mouth daily.   Yes Historical Provider, MD  ibuprofen (ADVIL,MOTRIN) 200 MG tablet Take 600 mg by mouth every 6 (six) hours as needed. For pain   Yes Historical Provider, MD  Prenatal Vit-Fe Fumarate-FA (PRENATAL MULTIVITAMIN) TABS Take 1 tablet by mouth daily.   Yes Historical Provider, MD  vitamin C (ASCORBIC ACID) 500 MG tablet Take 500 mg by mouth daily.   Yes Historical Provider, MD     Vital Signs: BP 116/68 (BP Location: Left Arm)   Pulse 95   Temp 98.6 F (37 C) (Oral)   Resp 18   Ht 6\' 3"  (1.905 m)   Wt 151 lb 9.6 oz (68.8 kg)   SpO2 98%   BMI 18.95 kg/m   Physical Exam  Constitutional: He is oriented to person, place, and time.  Abdominal: Soft. Bowel sounds are normal.  Musculoskeletal: Normal range of motion.  Neurological: He is alert and oriented to person, place, and time.  Skin: Skin is warm and dry.  Site is clean and dry NT No bleeding Output clear yellow color Less than 20 cc daily x 2 days afeb   Nursing note and vitals reviewed.   Imaging: Ct Abdomen Pelvis W Contrast  Result Date: 09/07/2016 CLINICAL DATA:  Nausea and vomiting.  Started last Monday. EXAM: CT ABDOMEN AND PELVIS WITH CONTRAST TECHNIQUE: Multidetector CT imaging of the abdomen and pelvis was performed using the standard protocol following bolus administration of intravenous contrast. CONTRAST:  1 ISOVUE-300 IOPAMIDOL (ISOVUE-300) INJECTION 61%  COMPARISON:  None. FINDINGS: Lower chest: No acute abnormality. Hepatobiliary: No focal liver abnormality is seen. No gallstones, gallbladder wall thickening, or biliary dilatation. Pancreas: Unremarkable. No pancreatic ductal dilatation or surrounding inflammatory changes. Spleen: Normal in size without focal abnormality. Adrenals/Urinary Tract: Adrenal glands are unremarkable. Kidneys are normal, without renal calculi, focal lesion, or hydronephrosis. Bladder is unremarkable. Stomach/Bowel: Dilated appendix measuring 11 mm with mucosal enhancement most concerning for acute appendicitis. At the base of the appendix there is mucosal discontinuity along filled with fluid concerning for ruptured appendicitis. 4.2 x 2.7 cm fluid collection in the right lower abdomen most consistent with an abscess. Multiple small bowel air-fluid levels with mild bowel wall thickening concerning for enteritis. No pneumatosis, pneumoperitoneum or portal venous gas. Vascular/Lymphatic: No significant vascular findings are present. No enlarged abdominal or pelvic lymph nodes. Reproductive: Prostate is unremarkable. Other: No abdominal wall hernia or abnormality. No abdominopelvic ascites. Musculoskeletal: No acute or significant osseous findings. IMPRESSION: 1. Findings most consistent with acute ruptured appendicitis. 4.2 x 2.7 cm fluid collection in the right lower abdomen most consistent with an abscess. Moderate amount of pelvic fluid. 2. Multiple small bowel air-fluid levels with mild bowel wall thickening concerning for enteritis secondary to appendiceal rupture. Critical Value/emergent results were called by telephone at the time of interpretation on 09/07/2016 at 3:44 pm to Dr. Tilden Fossa , who verbally acknowledged these results. Electronically Signed   By: Elige Ko   On: 09/07/2016 15:46  Ct Image Guided Drainage By Percutaneous Catheter  Result Date: 09/08/2016 CLINICAL DATA:  Appendicitis with peritoneal fluid  collection EXAM: CT GUIDED DRAINAGE OF PELVIC ABSCESS ANESTHESIA/SEDATION: Intravenous Fentanyl and Versed were administered as conscious sedation during continuous monitoring of the patient's level of consciousness and physiological / cardiorespiratory status by the radiology RN, with a total moderate sedation time of 12 minutes. PROCEDURE: The procedure, risks, benefits, and alternatives were explained to the patient. Questions regarding the procedure were encouraged and answered. The patient understands and consents to the procedure. Initially limited axial scans through the mid abdomen obtained with patient supine. A small periappendiceal collection was localized but due to the small size of the collection, and multiple overlying and adjacent loops of small bowel, percutaneous drainage was not thought reasonably feasible. Patient placed prone. Select axial scans through the pelvis were obtained. Persistent loculated cul-de-sac fluid collection was confirmed. Appropriate skin entry site was determined and marked. The operative field was prepped with chlorhexidinein a sterile fashion, and a sterile drape was applied covering the operative field. A sterile gown and sterile gloves were used for the procedure. Local anesthesia was provided with 1% Lidocaine. Under CT fluoroscopic guidance, a 18 gauge percutaneous entry needle was advanced into the collection using a left trans gluteal approach. Cloudy blood-tinged fluid could be aspirated. An Amplatz guidewire advanced easily within the collection, its position confirmed on CT. Tract was dilated to facilitate placement of a 12 French pigtail catheter, formed centrally within the collection. A sample of the aspirate sent for Gram stain, culture and sensitivity. Catheter secured externally with 0 Prolene suture and StatLock and placed to gravity drain bag. The patient tolerated the procedure well. COMPLICATIONS: None immediate FINDINGS: Small periappendiceal  collection, not amenable to percutaneous drainage. Loculated larger cul-de-sac fluid collection. 12-French pigtail drain catheter placed. Sample of the aspirate sent for Gram stain, culture and sensitivity. IMPRESSION: 1. Technically successful CT-guided pelvic abscess drain catheter placement. Electronically Signed   By: Corlis Leak  Hassell M.D.   On: 09/08/2016 12:48    Labs:  CBC:  Recent Labs  09/07/16 1154 09/08/16 0446 09/09/16 0822 09/10/16 0927  WBC 7.3 6.3 4.5 6.2  HGB 18.4* 15.5 13.5 13.5  HCT 51.8 44.3 39.3 39.8  PLT 226 215 198 211    COAGS:  Recent Labs  09/08/16 0446  INR 1.06    BMP:  Recent Labs  09/07/16 1154 09/08/16 0446 09/09/16 0822 09/10/16 0927  NA 132* 131* 135 134*  K 3.9 3.5 3.4* 3.7  CL 89* 94* 100* 100*  CO2 30 26 29 26   GLUCOSE 120* 145* 118* 109*  BUN 18 16 10  <5*  CALCIUM 9.5 8.6* 8.0* 8.0*  CREATININE 0.82 0.68 0.63 0.58*  GFRNONAA >60 >60 >60 >60  GFRAA >60 >60 >60 >60    LIVER FUNCTION TESTS:  Recent Labs  09/07/16 1154 09/08/16 0446  BILITOT 0.8 0.9  AST 14* 12*  ALT 8* 10*  ALKPHOS 75 68  PROT 7.8 6.4*  ALBUMIN 3.7 2.8*    Assessment and Plan:  Perforated appendix--- abscess Drain placed 3/11 Will follow Pt will hear from scheduler for OP drain clinic appt. Needs flushed daily with 5 cc sterile saline; keep clean and dry  Pt is aware of plan  Electronically Signed: Khristina Janota A 09/11/2016, 12:23 PM   I spent a total of 25 Minutes at the the patient's bedside AND on the patient's hospital floor or unit, greater than 50% of which was counseling/coordinating care for abscess drain

## 2016-09-11 NOTE — Discharge Summary (Signed)
Physician Discharge Summary  Patient ID: Dakota Goodman MRN: [id number] DOB/AGE: Jun 22, 1991 26 y.o.  Admit date: 09/07/2016 Discharge date: 09/11/2016  Admission Diagnoses:  Perforated appendix with periappendiceal abscess    Discharge Diagnoses:  Perforated appendix with periappendiceal abscess     Active Problems:   Perforated appendicitis   PROCEDURES: IR drain placement - 09/08/16,   Hospital Course:  Asked to see patient at the request of Dr. Pecola Leisure of the emergency department for 1 week history of lower abdominal pain, nausea and vomiting. The patient states she developed abdominal pain that was diffuse on Monday this week. He had significant nausea and vomiting over the next 2-3 days. The pain location now is just over his pubic bone in the midline. It is more on the left and right side of this. It is made worse with movement. He denies fever or chills. Denies any diarrhea or bloody stool. He has not had this type of pain before in the past. No history of Crohn's disease or other inflammatory bowel disorders. CT scan shows a perforated appendix with a 4 cm periependymal this abscess extending toward the midline. He has no evidence of hemodynamic instability.  IR drain was place and he has been kept on IV Zosyn for 3 days   He has improved and his antibiotics was converted to PO Augmentin 09/10/16.Culture from abscess aspirate is below, covered by Zosyn, but not Augmentin so we will change to Cipro/Flagyl on discharge.  WBC has remained stable during this time. Drainage is serosanguinous.  He will go home on one more week of Augmentin, and follow up as listed below.    He is to flush drain with 5 ml TID.    I have personally reviewed the patients medication history on the  controlled substance database.  CBC Latest Ref Rng & Units 09/10/2016 09/09/2016 09/08/2016  WBC 4.0 - 10.5 K/uL 6.2 4.5 6.3  Hemoglobin 13.0 - 17.0 g/dL 25.3 66.4 40.3  Hematocrit 39.0 - 52.0 % 39.8 39.3  44.3  Platelets 150 - 400 K/uL 211 198 215    CMP Latest Ref Rng & Units 09/10/2016 09/09/2016 09/08/2016  Glucose 65 - 99 mg/dL 474(Q) 595(G) 387(F)  BUN 6 - 20 mg/dL <6(E) 10 16  Creatinine 0.61 - 1.24 mg/dL 3.32(R) 5.18 8.41  Sodium 135 - 145 mmol/L 134(L) 135 131(L)  Potassium 3.5 - 5.1 mmol/L 3.7 3.4(L) 3.5  Chloride 101 - 111 mmol/L 100(L) 100(L) 94(L)  CO2 22 - 32 mmol/L 26 29 26   Calcium 8.9 - 10.3 mg/dL 8.0(L) 8.0(L) 8.6(L)  Total Protein 6.5 - 8.1 g/dL - - 6.4(L)  Total Bilirubin 0.3 - 1.2 mg/dL - - 0.9  Alkaline Phos 38 - 126 U/L - - 68  AST 15 - 41 U/L - - 12(L)  ALT 17 - 63 U/L - - 10(L)   Specimen Description ABSCESS   Special Requests CT ABSCESS   Gram Stain ABUNDANT WBC PRESENT, PREDOMINANTLY PMN  RARE GRAM POSITIVE COCCI IN PAIRS      Culture RARE PSEUDOMONAS AERUGINOSA  RARE CITROBACTER FREUNDII  NO ANAEROBES ISOLATED; CULTURE IN PROGRESS FOR 5 DAYS      Report Status PENDING   Organism ID, Bacteria PSEUDOMONAS AERUGINOSA   Organism ID, Bacteria CITROBACTER FREUNDII   Resulting Agency [facility]  Susceptibility    Pseudomonas aeruginosa Citrobacter freundii    MIC MIC    CEFAZOLIN   >=64 RESIST... Resistant    CEFEPIME <=1 SENSITIVE "><=1 SENSITIVE  Sensitive <=1 SENSITIVE "><=1  SENSITIVE  Sensitive    CEFTAZIDIME <=1 SENSITIVE "><=1 SENSITIVE  Sensitive <=1 SENSITIVE "><=1 SENSITIVE  Sensitive    CEFTRIAXONE   <=1 SENSITIVE "><=1 SENSITIVE  Sensitive    CIPROFLOXACIN <=0.25 SENSITIVE "><=0.25 SENS... Sensitive <=0.25 SENSITIVE "><=0.25 SENS... Sensitive    GENTAMICIN <=1 SENSITIVE "><=1 SENSITIVE  Sensitive <=1 SENSITIVE "><=1 SENSITIVE  Sensitive    IMIPENEM 1 SENSITIVE  Sensitive 0.5 SENSITIVE  Sensitive    PIP/TAZO <=4 SENSITIVE "><=4 SENSITIVE  Sensitive 8 SENSITIVE  Sensitive    TRIMETH/SULFA   <=20 SENSITIVE "><=20 SENSIT... Sensitive         Disposition: 01-Home or Self Care   Condition on D/C:  Improved    Allergies as of 09/11/2016       Reactions   Sulfa Drugs Cross Reactors Hives      Medication List    TAKE these medications   acetaminophen 325 MG tablet Commonly known as:  TYLENOL You can take 2 tablets every 4-6 hours as needed for pain.  Do not take more than 4000 mg of Tylenol(acetaminophen) per 24 hours.  This is also in your prescribed pain medicine.  Count each one as a Tylenol 325 mg.   amoxicillin-clavulanate 875-125 MG tablet Commonly known as:  AUGMENTIN Take 1 every 12 hours till completed.   b complex vitamins tablet Take 1 tablet by mouth daily.   ibuprofen 200 MG tablet Commonly known as:  ADVIL,MOTRIN You can take 2-3 tablets safely every 6 hours as needed for pain.  Use this first and then the prescribed pain medicine for pain not relieved by this or Tylenol. What changed:  how much to take  how to take this  when to take this  reasons to take this  additional instructions   ondansetron 4 MG disintegrating tablet Commonly known as:  ZOFRAN-ODT Take 1 tablet (4 mg total) by mouth every 6 (six) hours as needed for nausea.   oxyCODONE-acetaminophen 5-325 MG tablet Commonly known as:  PERCOCET/ROXICET Take 1-2 tablets by mouth every 4 (four) hours as needed for moderate pain.   prenatal multivitamin Tabs tablet Take 1 tablet by mouth daily.   saccharomyces boulardii 250 MG capsule Commonly known as:  FLORASTOR You can buy this at any drug store over the counter without a prescription.  Use it for the next month and until you are off antibiotics for 1-2 weeks.   vitamin C 500 MG tablet Commonly known as:  ASCORBIC ACID Take 500 mg by mouth daily.      Follow-up Information    [facility]. Schedule an appointment as soon as possible for a visit.   Contact information: [address] [place] [phone]       Emelia LoronWAKEFIELD,MATTHEW, MD. Schedule an appointment as soon as possible for a visit in 1 week(s).    Specialty:  General Surgery Why:  [facility]uch[facility]ice will follow up and call you with appointment.  If you don't hear by Monday, call and ask. Contact information: [address] [place] [address] [phone]        HASSELL III, Satira AnisAYNE DANIEL, MD Follow up.   Specialty:  Interventional Radiology Why:  pt will hear from IR OP drain clinic for time and date of follow up appt. Call [phone] if any questions or concerns Contact information: [address] [place] [address] [phone]           Signed: Sherrie GeorgeJENNINGS,Mimie Goering 09/11/2016, 2:29  PM   

## 2016-09-11 NOTE — Care Management Note (Signed)
Case Management Note  Patient Details  Name: Dakota Goodman MRN: [id number] Date of Birth: Mar 05, 1991  Subjective/Objective:                    Action/Plan:  [facility] provided and explained.   MATCH letter with 14 day Oxycodone 10 MG IR tan over ride given and explained.  Patient voiced understanding to all of the above. Expected Discharge Date:                  Expected Discharge Plan:  Home/Self Care  In-House Referral:     Discharge planning Services  CM Consult, Indigent Health Clinic, Medication Assistance, [facility]  Post Acute Care Choice:    Choice offered to:  Patient  DME Arranged:    DME Agency:     HH Arranged:    HH Agency:     Status of Service:  Completed, signed off  If discussed at [employer] Meetings, dates discussed:    Additional Comments:  [name], Conlin Brahm Marie, RN 09/11/2016, 8:56 AM

## 2016-09-11 NOTE — Discharge Instructions (Signed)
Percutaneous Abscess Drain Percutaneous abscess drain is removal of a collection of infected fluid inside the body (abscess). This is done by placing a thin needle under the skin and moving it into the abscess. A small tube (catheter) is inserted during the procedure and left in place for a few days to continue to drain the abscess.  Irrigate with 5 ml of saline three time a day.  Tell a health care provider about:  Any allergies you have.  All medicines you are taking, including vitamins, herbs, eye drops, creams, and over-the-counter medicines.  Any problems you or family members have had with anesthetic medicines.  Any blood disorders you have.  Any surgeries you have had.  Any medical conditions you have.  Whether you are pregnant or may be pregnant.  Any history of tobacco use or smoking. What are the risks? Generally, this is a safe procedure. However, problems may occur, including:  Infection.  Bleeding.  Allergic reaction to medicines or materials used.  Damage to other structures or organs.  Blockage of the catheter, requiring placement of a new catheter.  A need to repeat the procedure.  Failure of the procedure to drain the abscess completely, requiring an open surgical procedure to drain the abscess. An open procedure is done through a larger incision. What happens before the procedure? Medicines   Ask your health care provider about:  Changing or stopping your regular medicines. This is especially important if you are taking diabetes medicines or blood thinners.  Taking medicines such as aspirin and ibuprofen. These medicines can thin your blood. Do not take these medicines before your procedure if your health care provider instructs you not to. Staying hydrated  Follow instructions from your health care provider about hydration, which may include:  Up to 2 hours before the procedure - you may continue to drink clear liquids, such as water, clear fruit  juice, black coffee, and plain tea. Eating and drinking restrictions  Follow instructions from your health care provider about eating and drinking, which may include:  8 hours before the procedure - stop eating heavy meals or foods such as meat, fried foods, or fatty foods.  6 hours before the procedure - stop eating light meals or foods, such as toast or cereal.  6 hours before the procedure - stop drinking milk or drinks that contain milk.  2 hours before the procedure - stop drinking clear liquids. General instructions    Plan to have someone take you home from the hospital or clinic.  If you will be going home right after the procedure, plan to have someone with you for 24 hours.  You may have blood tests or urine tests.  You may get a tetanus shot.  You may have imaging tests, such as an ultrasound, to check how large or deep your abscess is. What happens during the procedure?  To lower your risk of infection:  Your health care team will wash or sanitize their hands.  The skin around the abscess will be washed with soap.  Hair may be removed from the surgical site.  An IV tube will be inserted into one of your veins.  You will be given medicine to numb the area (local anesthetic) where the catheter will be placed. Placement of the catheter varies depending on where your abscess is located.  You may be given medicine to help you relax (sedative) or medicine to make you fall asleep (general anesthetic).  A small incision will be made in  your skin.  A needle will be inserted under your skin and moved into the abscess. Images from ultrasound, X-ray, or a CT scan will be used to help guide the needle to the abscess.  A catheter will be inserted into your incision and moved underneath your skin until it reaches the abscess. Images will be used to help guide the catheter to the abscess.  After the catheter is in place, the needle will be removed. The catheter will be  connected to a bag outside of your body. The catheter will stay in place until the fluid has stopped draining and the infection is gone. The procedure may vary among health care providers and hospitals. What happens after the procedure?  Your blood pressure, heart rate, breathing rate, and blood oxygen level will be monitored until the medicines you were given have worn off.  You may have some pain or nausea. Medicines will be available to help you. Summary  An abscess is a collection of infected fluid inside the body.  During this procedure, images from ultrasound, X-rays, or a CT scan are used to help guide the needle and catheter to the abscess.  A catheter will be left in place to continue to drain the abscess after the procedure. This information is not intended to replace advice given to you by your health care provider. Make sure you discuss any questions you have with your health care provider. Document Released: 11/01/2013 Document Revised: 05/09/2016 Document Reviewed: 05/09/2016 Elsevier Interactive Patient Education  2017 Else[employer] Inc.  Appendicitis The appendix is a tube that is shaped like a finger. It is connected to the large intestine. Appendicitis means that this tube is swollen (inflamed). Without treatment, the tube can tear (rupture). This can lead to a life-threatening infection. It can also cause you to have sores (abscesses). These sores hurt. What are the causes? This condition may be caused by something that blocks the appendix, such as:  A ball of poop (stool).  Lymph glands that are bigger than normal. Sometimes, the cause is not known. What are the signs or symptoms? Symptoms of this condition include:  Pain around the belly button (navel).  The pain moves toward the lower right belly (abdomen).  The pain can get worse with time.  The pain can get worse if you cough.  The pain can get worse if you move suddenly.  Tenderness in the lower right  belly.  Feeling sick to your stomach (nauseous).  Throwing up (vomiting).  Not feeling hungry (loss of appetite).  A fever.  Having a hard time pooping (constipation).  Watery poop (diarrhea).  Not feeling well. How is this treated? Usually, this condition is treated by taking out the appendix (appendectomy). There are two ways that the appendix can be taken out:  Open surgery. In this surgery, the appendix is taken out through a large cut (incision). The cut is made in the lower right belly. This surgery may be picked if:  You have scars from another surgery.  You have a bleeding condition.  You are pregnant and will be having your baby soon.  You have a condition that does not allow the other type of surgery.  Laparoscopic surgery. In this surgery, the appendix is taken out through small cuts. Often, this surgery:  Causes less pain.  Causes fewer problems.  Is easier to heal from. If your appendix tears and a sore forms:  A drain may be put into the sore. The drain will be used  to get rid of fluid.  You may get an antibiotic medicine through an IV tube.  Your appendix may or may not need to be taken out. This information is not intended to replace advice given to you by your health care provider. Make sure you discuss any questions you have with your health care provider. Document Released: 09/09/2011 Document Revised: 11/23/2015 Document Reviewed: 11/02/2014 Elsevier Interactive Patient Education  2017 [employer].

## 2016-09-11 NOTE — Progress Notes (Signed)
[facility] Progress Note     Subjective: Pt states his nausea is better. He ate more yesterday. No vomiting or fevers. Pt is worried at drain care at discharge. He states his sister is able to help him at home.   Objective: Vital signs in last 24 hours: Temp:  [98.1 F (36.7 C)-98.7 F (37.1 C)] 98.6 F (37 C) (03/14 0435) Pulse Rate:  [85-95] 95 (03/14 0435) Resp:  [18] 18 (03/14 0435) BP: (116-129)/(62-75) 116/68 (03/14 0435) SpO2:  [98 %-99 %] 98 % (03/14 0435) Last BM Date: 09/10/16  Intake/Output from previous day: 03/13 0701 - 03/14 0700 In: 135 [P.O.:120] Out: 2270 [Urine:2250; Drains:20] Intake/Output this shift: No intake/output data recorded.  PE: Gen: Alert, NAD, pleasant, cooperative, well appearing Card: RRR, no M/G/R heard Pulm: CTA, no W/R/R, effort normal Abd: Soft, not distended,+BS, drain siteC/D/I, drain with scant serosanguinous drainage, mild generalized TTP 2/10 Skin: no rashes noted, warm and dry, not diaphoretic  Lab Results:   Recent Labs  09/09/16 0822 09/10/16 0927  WBC 4.5 6.2  HGB 13.5 13.5  HCT 39.3 39.8  PLT 198 211   BMET  Recent Labs  09/09/16 0822 09/10/16 0927  NA 135 134*  K 3.4* 3.7  CL 100* 100*  CO2 29 26  GLUCOSE 118* 109*  BUN 10 <5*  CREATININE 0.63 0.58*  CALCIUM 8.0* 8.0*   PT/INR No results for input(s): LABPROT, INR in the last 72 hours. CMP     Component Value Date/Time   NA 134 (L) 09/10/2016 0927   K 3.7 09/10/2016 0927   CL 100 (L) 09/10/2016 0927   CO2 26 09/10/2016 0927   GLUCOSE 109 (H) 09/10/2016 0927   BUN <5 (L) 09/10/2016 0927   CREATININE 0.58 (L) 09/10/2016 0927   CALCIUM 8.0 (L) 09/10/2016 0927   PROT 6.4 (L) 09/08/2016 0446   ALBUMIN 2.8 (L) 09/08/2016 0446   AST 12 (L) 09/08/2016 0446   ALT 10 (L) 09/08/2016 0446   ALKPHOS 68 09/08/2016 0446   BILITOT 0.9 09/08/2016 0446   GFRNONAA >60 09/10/2016 0927   GFRAA >60 09/10/2016 0927   Lipase     Component  Value Date/Time   LIPASE <10 (L) 09/07/2016 1154       Studies/Results: No results found.  Anti-infectives: Anti-infectives    Start     Dose/Rate Route Frequency Ordered Stop   09/10/16 1100  amoxicillin-clavulanate (AUGMENTIN) 875-125 MG per tablet 1 tablet     1 tablet Oral Every 12 hours 09/10/16 1020     09/07/16 2200  piperacillin-tazobactam (ZOSYN) IVPB 3.375 g  Status:  Discontinued     3.375 g 12.5 mL/hr over 240 Minutes Intravenous Every 8 hours 09/07/16 1623 09/10/16 1020   09/07/16 1600  piperacillin-tazobactam (ZOSYN) IVPB 3.375 g     3.375 g 100 mL/hr over 30 Minutes Intravenous  Once 09/07/16 1548 09/07/16 1658       Assessment/Plan Perforated appendicitis with abscess - IR placed drain  - WBC normal, afebrile - having nonbloody BM's - abdominal pain continues to improve  FEN: soft diet WUJ:[id number]:lovenox ID: Zosyn 3/10>>  Plan: Non operative management. Nausea has improved and pt ate more yesterday. Will discharge this afternoon.   LOS: 4 days    Jerre SimonJessica L Jeronda Don , [facility] 09/11/2016, 8:07 AM Pager: [phone] Consults: [phone] Mon-Fri 7:00 am-4:30 pm Sat-Sun 7:00 am-11:30 am

## 2016-09-12 ENCOUNTER — Other Ambulatory Visit: Payer: Self-pay | Admitting: General Surgery

## 2016-09-12 DIAGNOSIS — K352 Acute appendicitis with generalized peritonitis, without abscess: Secondary | ICD-10-CM

## 2016-09-16 LAB — AEROBIC/ANAEROBIC CULTURE W GRAM STAIN (SURGICAL/DEEP WOUND)

## 2016-09-16 LAB — AEROBIC/ANAEROBIC CULTURE (SURGICAL/DEEP WOUND)

## 2016-09-19 ENCOUNTER — Inpatient Hospital Stay: Payer: Self-pay

## 2016-09-19 ENCOUNTER — Ambulatory Visit
Admission: RE | Admit: 2016-09-19 | Discharge: 2016-09-19 | Disposition: A | Discharge status: Home / Self Care | Payer: Self-pay | Source: Ambulatory Visit | Attending: General Surgery | Admitting: General Surgery

## 2016-09-19 ENCOUNTER — Ambulatory Visit
Admission: RE | Admit: 2016-09-19 | Discharge: 2016-09-19 | Disposition: A | Discharge status: Home / Self Care | Payer: Self-pay | Source: Ambulatory Visit | Attending: Radiology | Admitting: Radiology

## 2016-09-19 DIAGNOSIS — K352 Acute appendicitis with generalized peritonitis, without abscess: Secondary | ICD-10-CM

## 2016-09-19 HISTORY — PX: IR RADIOLOGIST EVAL & MGMT: IMG5224

## 2016-09-19 MED ORDER — IOPAMIDOL (ISOVUE-300) INJECTION 61%
100.0000 mL | Freq: Once | INTRAVENOUS | Status: AC | PRN
  Administered 2016-09-19: 100 mL via INTRAVENOUS

## 2016-09-19 NOTE — Progress Notes (Signed)
Chief Complaint: Patient was seen in consultation today for follow up of pelvic abscess drain at the request of Dr. Emelia Loron  Referring Physician(s): Dr. Emelia Loron  History of Present Illness: Dakota Goodman is a 26 y.o. male who underwent perc drain of pelvic abscess secondary to appendicitis on 3/11. He is feeling much better. Reports minimal drainage. No abd pain, fevers, N/V. Says his bowels are moving well. He is here today for follow up CT and possible drain injection. Meds reviewed, he states he has finished the Cipro but still has Flagyl that he is taking.  Past Medical History:  Diagnosis Date  . Chronic back pain    "all over"  . Family history of adverse reaction to anesthesia    "sister could feel qthing when she woke up; had to get adrenaline for low heart rate" (09/09/2016)    Past Surgical History:  Procedure Laterality Date  . ABSCESS DRAINAGE  09/07/2016   Percutaneous drainage of periappendiceal abscess in IR      Allergies: Sulfa drugs cross reactors  Medications: Prior to Admission medications   Medication Sig Start Date End Date Taking? Authorizing Provider  acetaminophen (TYLENOL) 325 MG tablet You can take 2 tablets every 4-6 hours as needed for pain.  Do not take more than 4000 mg of Tylenol(acetaminophen) per 24 hours.  This is also in your prescribed pain medicine.  Count each one as a Tylenol 325 mg. 09/11/16   Sherrie George, PA-C  b complex vitamins tablet Take 1 tablet by mouth daily.    Historical Provider, MD  ciprofloxacin (CIPRO) 500 MG tablet Take 1 tablet (500 mg total) by mouth 2 (two) times daily. 09/11/16 09/21/16  Sherrie George, PA-C  ibuprofen (ADVIL,MOTRIN) 200 MG tablet You can take 2-3 tablets safely every 6 hours as needed for pain.  Use this first and then the prescribed pain medicine for pain not relieved by this or Tylenol. 09/11/16   Sherrie George, PA-C  metroNIDAZOLE (FLAGYL) 500 MG tablet Take 1  tablet (500 mg total) by mouth every 8 (eight) hours. 09/11/16 10/02/16  Sherrie George, PA-C  ondansetron (ZOFRAN-ODT) 4 MG disintegrating tablet Take 1 tablet (4 mg total) by mouth every 6 (six) hours as needed for nausea. 09/11/16   Sherrie George, PA-C  oxyCODONE-acetaminophen (PERCOCET/ROXICET) 5-325 MG tablet Take 1-2 tablets by mouth every 4 (four) hours as needed for moderate pain. 09/11/16   Sherrie George, PA-C  Prenatal Vit-Fe Fumarate-FA (PRENATAL MULTIVITAMIN) TABS Take 1 tablet by mouth daily.    Historical Provider, MD  saccharomyces boulardii (FLORASTOR) 250 MG capsule You can buy this at any drug store over the counter without a prescription.  Use it for the next month and until you are off antibiotics for 1-2 weeks. 09/11/16   Sherrie George, PA-C  vitamin C (ASCORBIC ACID) 500 MG tablet Take 500 mg by mouth daily.    Historical Provider, MD     No family history on file.  Social History   Social History  . Marital status: Single    Spouse name: N/A  . Number of children: N/A  . Years of education: N/A   Social History Main Topics  . Smoking status: Never Smoker  . Smokeless tobacco: Never Used  . Alcohol use Yes     Comment: 09/09/2016 "1 beer last week; that was the first drink in years"  . Drug use: No  . Sexual activity: Not on file   Other Topics Concern  .  Not on file   Social History Narrative  . No narrative on file      Review of Systems: A 12 point ROS discussed and pertinent positives are indicated in the HPI above.  All other systems are negative.  Review of Systems  Vital Signs: There were no vitals taken for this visit.  Physical Exam  Constitutional: He is oriented to person, place, and time. He appears well-developed and well-nourished. No distress.  HENT:  Head: Normocephalic.  Mouth/Throat: Oropharynx is clear and moist.  Neck: Normal range of motion. No tracheal deviation present.  Cardiovascular: Normal rate, regular rhythm and  normal heart sounds.   Pulmonary/Chest: Effort normal and breath sounds normal. No respiratory distress.  Abdominal: Soft. He exhibits no distension. There is no tenderness.  Neurological: He is alert and oriented to person, place, and time.  Skin:  (L)Transgluteal drain intact, site clean Drain injection performed shows no fistulous connections.(Full report in PACS)  Psychiatric: He has a normal mood and affect.     Imaging: Ct Abdomen Pelvis W Contrast  Result Date: 09/19/2016 CLINICAL DATA:  Follow-up perforated appendix. Percutaneous drainage catheter placed on 09/08/2016. EXAM: CT ABDOMEN AND PELVIS WITH CONTRAST TECHNIQUE: Multidetector CT imaging of the abdomen and pelvis was performed using the standard protocol following bolus administration of intravenous contrast. CONTRAST:  100mL ISOVUE-300 IOPAMIDOL (ISOVUE-300) INJECTION 61% COMPARISON:  09/07/2016 FINDINGS: Lower chest: Lung bases are clear. Hepatobiliary: Normal appearance of the liver, gallbladder and portal venous system. Pancreas: Normal appearance of the pancreas without inflammation or duct dilatation. Spleen: Normal appearance of spleen without enlargement. Adrenals/Urinary Tract: Normal adrenal glands. Normal appearance of both kidneys without hydronephrosis. Urinary bladder is unremarkable. Stomach/Bowel: Fecal-like material in the distal small bowel including the terminal ileum. Moderate to large amount of stool in the colon. Rectum is distended and contains a large amount of stool. There is concern for mild wall thickening and wall enhancement of the distal small bowel. Decompressed loop of bowel in the mid abdomen appears to have wall thickening measuring up to 0.6 cm on sequence 3, image 63. Question mild wall thickening in the appendix without distension. The periappendiceal fluid has essentially resolved. There may be trace fluid near the base of the appendix and just medial to the cecum on sequence 3, image 64. Minimal  fat stranding around the appendix. Small amount of material just posterior to the left inferior epigastric vessels on sequence 3, image 71. Previously, this material was more compatible with fluid and now it may represent a small decompressed fluid collection. Small amount of residual edema or fluid just posterior to the right rectus musculature on sequence 3, image 70. Vascular/Lymphatic: No significant vascular findings are present. No enlarged abdominal or pelvic lymph nodes. Reproductive: Prostate is unremarkable. Other: Stable position of the left trans gluteal drainage catheter. No significant fluid surrounding this catheter. The pelvic fluid has resolved. The small amount of fluid in the right lower quadrant near the appendix has essentially resolved. There may be trace residual fluid just medial to the cecum on image 64. Negative for free intraperitoneal air. Musculoskeletal: No acute abnormality. IMPRESSION: The pelvic fluid and periappendiceal collections have essentially resolved. No significant fluid around the drainage catheter. Mild inflammatory changes remaining in the lower abdomen which appear to be centered around a few loops of small bowel. Findings could be related to enteritis. There continues to be a small amount of fluid in the anterior pelvic region. There continues to be mild inflammatory changes around the  appendix. Large amount of stool in the rectum and colon. There is also fecal like material in the distal small bowel suggesting slow bowel transit and probable constipation. No evidence for a bowel obstruction. Electronically Signed   By: Richarda Overlie M.D.   On: 09/19/2016 10:31   Ct Abdomen Pelvis W Contrast  Result Date: 09/07/2016 CLINICAL DATA:  Nausea and vomiting.  Started last Monday. EXAM: CT ABDOMEN AND PELVIS WITH CONTRAST TECHNIQUE: Multidetector CT imaging of the abdomen and pelvis was performed using the standard protocol following bolus administration of intravenous  contrast. CONTRAST:  1 ISOVUE-300 IOPAMIDOL (ISOVUE-300) INJECTION 61% COMPARISON:  None. FINDINGS: Lower chest: No acute abnormality. Hepatobiliary: No focal liver abnormality is seen. No gallstones, gallbladder wall thickening, or biliary dilatation. Pancreas: Unremarkable. No pancreatic ductal dilatation or surrounding inflammatory changes. Spleen: Normal in size without focal abnormality. Adrenals/Urinary Tract: Adrenal glands are unremarkable. Kidneys are normal, without renal calculi, focal lesion, or hydronephrosis. Bladder is unremarkable. Stomach/Bowel: Dilated appendix measuring 11 mm with mucosal enhancement most concerning for acute appendicitis. At the base of the appendix there is mucosal discontinuity along filled with fluid concerning for ruptured appendicitis. 4.2 x 2.7 cm fluid collection in the right lower abdomen most consistent with an abscess. Multiple small bowel air-fluid levels with mild bowel wall thickening concerning for enteritis. No pneumatosis, pneumoperitoneum or portal venous gas. Vascular/Lymphatic: No significant vascular findings are present. No enlarged abdominal or pelvic lymph nodes. Reproductive: Prostate is unremarkable. Other: No abdominal wall hernia or abnormality. No abdominopelvic ascites. Musculoskeletal: No acute or significant osseous findings. IMPRESSION: 1. Findings most consistent with acute ruptured appendicitis. 4.2 x 2.7 cm fluid collection in the right lower abdomen most consistent with an abscess. Moderate amount of pelvic fluid. 2. Multiple small bowel air-fluid levels with mild bowel wall thickening concerning for enteritis secondary to appendiceal rupture. Critical Value/emergent results were called by telephone at the time of interpretation on 09/07/2016 at 3:44 pm to Dr. Tilden Fossa , who verbally acknowledged these results. Electronically Signed   By: Elige Ko   On: 09/07/2016 15:46   Ct Image Guided Drainage By Percutaneous Catheter  Result  Date: 09/08/2016 CLINICAL DATA:  Appendicitis with peritoneal fluid collection EXAM: CT GUIDED DRAINAGE OF PELVIC ABSCESS ANESTHESIA/SEDATION: Intravenous Fentanyl and Versed were administered as conscious sedation during continuous monitoring of the patient's level of consciousness and physiological / cardiorespiratory status by the radiology RN, with a total moderate sedation time of 12 minutes. PROCEDURE: The procedure, risks, benefits, and alternatives were explained to the patient. Questions regarding the procedure were encouraged and answered. The patient understands and consents to the procedure. Initially limited axial scans through the mid abdomen obtained with patient supine. A small periappendiceal collection was localized but due to the small size of the collection, and multiple overlying and adjacent loops of small bowel, percutaneous drainage was not thought reasonably feasible. Patient placed prone. Select axial scans through the pelvis were obtained. Persistent loculated cul-de-sac fluid collection was confirmed. Appropriate skin entry site was determined and marked. The operative field was prepped with chlorhexidinein a sterile fashion, and a sterile drape was applied covering the operative field. A sterile gown and sterile gloves were used for the procedure. Local anesthesia was provided with 1% Lidocaine. Under CT fluoroscopic guidance, a 18 gauge percutaneous entry needle was advanced into the collection using a left trans gluteal approach. Cloudy blood-tinged fluid could be aspirated. An Amplatz guidewire advanced easily within the collection, its position confirmed on CT. Tract was  dilated to facilitate placement of a 12 French pigtail catheter, formed centrally within the collection. A sample of the aspirate sent for Gram stain, culture and sensitivity. Catheter secured externally with 0 Prolene suture and StatLock and placed to gravity drain bag. The patient tolerated the procedure well.  COMPLICATIONS: None immediate FINDINGS: Small periappendiceal collection, not amenable to percutaneous drainage. Loculated larger cul-de-sac fluid collection. 12-French pigtail drain catheter placed. Sample of the aspirate sent for Gram stain, culture and sensitivity. IMPRESSION: 1. Technically successful CT-guided pelvic abscess drain catheter placement. Electronically Signed   By: Corlis Leak M.D.   On: 09/08/2016 12:48    Labs:  CBC:  Recent Labs  09/07/16 1154 09/08/16 0446 09/09/16 0822 09/10/16 0927  WBC 7.3 6.3 4.5 6.2  HGB 18.4* 15.5 13.5 13.5  HCT 51.8 44.3 39.3 39.8  PLT 226 215 198 211    COAGS:  Recent Labs  09/08/16 0446  INR 1.06    BMP:  Recent Labs  09/07/16 1154 09/08/16 0446 09/09/16 0822 09/10/16 0927  NA 132* 131* 135 134*  K 3.9 3.5 3.4* 3.7  CL 89* 94* 100* 100*  CO2 30 26 29 26   GLUCOSE 120* 145* 118* 109*  BUN 18 16 10  <5*  CALCIUM 9.5 8.6* 8.0* 8.0*  CREATININE 0.82 0.68 0.63 0.58*  GFRNONAA >60 >60 >60 >60  GFRAA >60 >60 >60 >60    LIVER FUNCTION TESTS:  Recent Labs  09/07/16 1154 09/08/16 0446  BILITOT 0.8 0.9  AST 14* 12*  ALT 8* 10*  ALKPHOS 75 68  PROT 7.8 6.4*  ALBUMIN 3.7 2.8*    TUMOR MARKERS: No results for input(s): AFPTM, CEA, CA199, CHROMGRNA in the last 8760 hours.  Assessment and Plan: Appendicitis with associated pelvic abscess S/p perc drain 3/11 Repeat CT shows interval improvement, minimal residual inflammatory changes and abscess resolved. Drain injection shows no fistula Drain removed at bedside. Pt to follow up with surgery.  Thank you for this interesting consult.  I greatly enjoyed meeting [employer] Frankl and look forward to participating in their care.  A copy of this report was sent to the requesting provider on this date.  Electronically Signed: Brayton El 09/19/2016, 10:43 AM   I spent a total of 25 minutes in face to face in clinical consultation, greater than 50% of which was  counseling/coordinating care for pelvic abscess drainage

## 2016-12-13 ENCOUNTER — Encounter: Payer: Self-pay | Admitting: Radiology

## 2017-01-26 ENCOUNTER — Emergency Department (HOSPITAL_COMMUNITY): Payer: Self-pay

## 2017-01-26 ENCOUNTER — Inpatient Hospital Stay (HOSPITAL_COMMUNITY)
Admission: EM | Admit: 2017-01-26 | Discharge: 2017-01-28 | DRG: 343 | Disposition: A | Discharge status: Home / Self Care | Payer: Self-pay | Attending: General Surgery | Admitting: General Surgery

## 2017-01-26 ENCOUNTER — Encounter (HOSPITAL_COMMUNITY): Payer: Self-pay | Admitting: Emergency Medicine

## 2017-01-26 DIAGNOSIS — K353 Acute appendicitis with localized peritonitis, without perforation or gangrene: Secondary | ICD-10-CM

## 2017-01-26 DIAGNOSIS — R300 Dysuria: Secondary | ICD-10-CM | POA: Diagnosis present

## 2017-01-26 DIAGNOSIS — K3580 Unspecified acute appendicitis: Principal | ICD-10-CM | POA: Diagnosis present

## 2017-01-26 LAB — CBC
HCT: 46.6 % (ref 39.0–52.0)
Hemoglobin: 15.9 g/dL (ref 13.0–17.0)
MCH: 30.6 pg (ref 26.0–34.0)
MCHC: 34.1 g/dL (ref 30.0–36.0)
MCV: 89.8 fL (ref 78.0–100.0)
PLATELETS: 183 10*3/uL (ref 150–400)
RBC: 5.19 MIL/uL (ref 4.22–5.81)
RDW: 12.8 % (ref 11.5–15.5)
WBC: 12.2 10*3/uL — AB (ref 4.0–10.5)

## 2017-01-26 LAB — COMPREHENSIVE METABOLIC PANEL
ALBUMIN: 4.2 g/dL (ref 3.5–5.0)
ALT: 15 U/L — AB (ref 17–63)
AST: 20 U/L (ref 15–41)
Alkaline Phosphatase: 87 U/L (ref 38–126)
Anion gap: 9 (ref 5–15)
BUN: 6 mg/dL (ref 6–20)
CHLORIDE: 105 mmol/L (ref 101–111)
CO2: 25 mmol/L (ref 22–32)
CREATININE: 0.65 mg/dL (ref 0.61–1.24)
Calcium: 8.6 mg/dL — ABNORMAL LOW (ref 8.9–10.3)
GFR calc Af Amer: >60 mL/min (ref >60)
GLUCOSE: 120 mg/dL — AB (ref 65–99)
Potassium: 3.2 mmol/L — ABNORMAL LOW (ref 3.5–5.1)
Sodium: 139 mmol/L (ref 135–145)
Total Bilirubin: 0.7 mg/dL (ref 0.3–1.2)
Total Protein: 6.3 g/dL — ABNORMAL LOW (ref 6.5–8.1)

## 2017-01-26 LAB — DIFFERENTIAL
BASOS PCT: 0 %
Basophils Absolute: 0 10*3/uL (ref 0.0–0.1)
Eosinophils Absolute: 0 10*3/uL (ref 0.0–0.7)
Eosinophils Relative: 0 %
LYMPHS PCT: 13 %
Lymphs Abs: 1.6 10*3/uL (ref 0.7–4.0)
MONO ABS: 0.7 10*3/uL (ref 0.1–1.0)
Monocytes Relative: 6 %
NEUTROS ABS: 9.8 10*3/uL — AB (ref 1.7–7.7)
NEUTROS PCT: 81 %

## 2017-01-26 LAB — URINALYSIS, ROUTINE W REFLEX MICROSCOPIC
BACTERIA UA: NONE SEEN
Bilirubin Urine: NEGATIVE
Glucose, UA: NEGATIVE mg/dL
Hgb urine dipstick: NEGATIVE
Ketones, ur: NEGATIVE mg/dL
Leukocytes, UA: NEGATIVE
Nitrite: NEGATIVE
PH: 8 (ref 5.0–8.0)
Protein, ur: 100 mg/dL — AB
SPECIFIC GRAVITY, URINE: 1.02 (ref 1.005–1.030)
SQUAMOUS EPITHELIAL / LPF: NONE SEEN

## 2017-01-26 LAB — LIPASE, BLOOD: LIPASE: 22 U/L (ref 11–51)

## 2017-01-26 MED ORDER — SODIUM CHLORIDE 0.9 % IV BOLUS (SEPSIS)
1000.0000 mL | Freq: Once | INTRAVENOUS | Status: AC
Start: 2017-01-26 — End: 2017-01-26
  Administered 2017-01-26: 1000 mL via INTRAVENOUS

## 2017-01-26 MED ORDER — ONDANSETRON 4 MG PO TBDP
ORAL_TABLET | ORAL | Status: AC
  Filled 2017-01-26: qty 1

## 2017-01-26 MED ORDER — ONDANSETRON 4 MG PO TBDP
4.0000 mg | ORAL_TABLET | Freq: Once | ORAL | Status: AC | PRN
  Administered 2017-01-26: 4 mg via ORAL

## 2017-01-26 MED ORDER — ACETAMINOPHEN 500 MG PO TABS
1000.0000 mg | ORAL_TABLET | Freq: Once | ORAL | Status: AC
  Administered 2017-01-26: 1000 mg via ORAL
  Filled 2017-01-26: qty 2

## 2017-01-26 MED ORDER — IOPAMIDOL (ISOVUE-300) INJECTION 61%
INTRAVENOUS | Status: AC
  Administered 2017-01-26: 100 mL
  Filled 2017-01-26: qty 100

## 2017-01-26 MED ORDER — DEXTROSE 5 % IV SOLN
2.0000 g | Freq: Once | INTRAVENOUS | Status: AC
  Administered 2017-01-26: 2 g via INTRAVENOUS
  Filled 2017-01-26: qty 2

## 2017-01-26 MED ORDER — MORPHINE SULFATE (PF) 4 MG/ML IV SOLN
4.0000 mg | Freq: Once | INTRAVENOUS | Status: AC
  Administered 2017-01-26: 4 mg via INTRAVENOUS
  Filled 2017-01-26: qty 1

## 2017-01-26 MED ORDER — METRONIDAZOLE IN NACL 5-0.79 MG/ML-% IV SOLN
500.0000 mg | Freq: Once | INTRAVENOUS | Status: AC
  Administered 2017-01-26: 500 mg via INTRAVENOUS
  Filled 2017-01-26: qty 100

## 2017-01-26 NOTE — ED Triage Notes (Signed)
Reports having appendicitis in march and was treated with antibiotics and a drain.  Now having pain LUQ and RLQ, LLQ.  Reports appendix are located on the left side.  Also reporting urinary burning, frequency, and urgency.

## 2017-01-26 NOTE — ED Provider Notes (Signed)
[facility]-EMERGENCY DEPT Provider Note   CSN: [id number] Arrival date & time: 01/26/17  1943     History   Chief Complaint Chief Complaint  Patient presents with  . Abdominal Pain    HPI Dakota Goodman is a 26 y.o. male.  26 year old male with no significant past medical history presents to the emergency department for evaluation of abdominal pain. He reports lower abdominal aching pain which began at 8 AM today. Pain is nonradiating. Patient denies known modifying factors. He took Tylenol for symptoms with minimal relief. He states that he has experienced some nausea and two looser BMs today. Patient also reporting "soreness" when voiding, "like it's the muscles". No diarrhea, hematochezia, melena, fever, dysuria, vomiting. Hx of drain placement for periappendiceal abscess in March 2018; no appendectomy. Patient notes that his appendix is located on the left.      Past Medical History:  Diagnosis Date  . Chronic back pain    "all over"  . Family history of adverse reaction to anesthesia    "sister could feel qthing when she woke up; had to get adrenaline for low heart rate" (09/09/2016)    Patient Active Problem List   Diagnosis Date Noted  . Perforated appendicitis 09/07/2016    Past Surgical History:  Procedure Laterality Date  . ABSCESS DRAINAGE  09/07/2016   Percutaneous drainage of periappendiceal abscess in IR    . IR RADIOLOGIST EVAL & MGMT  09/19/2016       Home Medications    Prior to Admission medications   Medication Sig Start Date End Date Taking? Authorizing Provider  Multiple Vitamin (MULTIVITAMIN WITH MINERALS) TABS tablet Take 1 tablet by mouth daily.   Yes [provider]  Probiotic Product (PROBIOTIC PO) Take 1 capsule by mouth daily.   Yes [provider]    Family History No family history on file.  Social History Social History  Substance Use Topics  . Smoking status: Never Smoker  . Smokeless tobacco: Never Used  .  Alcohol use Yes     Comment: 09/09/2016 "1 beer last week; that was the first drink in years"     Allergies   Other and Sulfa drugs cross reactors   Review of Systems Review of Systems Ten systems reviewed and are negative for acute change, except as noted in the HPI.    Physical Exam Updated Vital Signs BP (!) 147/71   Pulse 100   Temp 98.8 F (37.1 C) (Oral)   Resp 16   Ht 6\' 3"  (1.905 m)   Wt 68 kg (150 lb)   SpO2 100%   BMI 18.75 kg/m   Physical Exam  Constitutional: He is oriented to person, place, and time. He appears well-developed and well-nourished. No distress.  Nontoxic appearing and in NAD  HENT:  Head: Normocephalic and atraumatic.  Eyes: Conjunctivae and EOM are normal. No scleral icterus.  Neck: Normal range of motion.  Cardiovascular: Normal rate, regular rhythm and intact distal pulses.   Pulmonary/Chest: Effort normal. No respiratory distress. He has no wheezes.  Respirations even and unlabored  Abdominal: Soft.  Minimal bilateral lower quadrant TTP. No involuntary guarding or distension. No peritoneal signs.  Musculoskeletal: Normal range of motion.  Neurological: He is alert and oriented to person, place, and time. He exhibits normal muscle tone. Coordination normal.  GCS 15. Patient moving all extremities.  Skin: Skin is warm and dry. No rash noted. He is not diaphoretic. No erythema. No pallor.  Psychiatric: He has a  normal mood and affect. His behavior is normal.  Nursing note and vitals reviewed.    ED Treatments / Results  Labs (all labs ordered are listed, but only abnormal results are displayed) Labs Reviewed  COMPREHENSIVE METABOLIC PANEL - Abnormal; Notable for the following:       Result Value   Potassium 3.2 (*)    Glucose, Bld 120 (*)    Calcium 8.6 (*)    Total Protein 6.3 (*)    ALT 15 (*)    All other components within normal limits  CBC - Abnormal; Notable for the following:    WBC 12.2 (*)    All other components within  normal limits  URINALYSIS, ROUTINE W REFLEX MICROSCOPIC - Abnormal; Notable for the following:    Protein, ur 100 (*)    All other components within normal limits  LIPASE, BLOOD  DIFFERENTIAL    EKG  EKG Interpretation None       Radiology Ct Abdomen Pelvis W Contrast  Result Date: 01/26/2017 CLINICAL DATA:  Lower abdominal pain, prior periappendiceal abscess drainage EXAM: CT ABDOMEN AND PELVIS WITH CONTRAST TECHNIQUE: Multidetector CT imaging of the abdomen and pelvis was performed using the standard protocol following bolus administration of intravenous contrast. CONTRAST:  100mL ISOVUE-300 IOPAMIDOL (ISOVUE-300) INJECTION 61% COMPARISON:  09/19/2016 FINDINGS: Lower chest: Lung bases are clear. Hepatobiliary: Liver is within normal limits. Gallbladder is underdistended but unremarkable. Pancreas: Within normal limits. Spleen: Within normal limits. Adrenals/Urinary Tract: Adrenal glands within normal limits. Excretory contrast in the bilateral collecting system. Kidneys are otherwise within normal limits. No hydronephrosis. Bladder is within normal limits. Stomach/Bowel: Stomach is within normal limits. No evidence of bowel obstruction. Dilated appendix, measuring 12 mm, with associated mucosal enhancement and surrounding periappendiceal inflammatory changes (series 3/ image 68), reflecting recurrent acute appendicitis. No drainable fluid collection/ abscess. No free air to suggest macroscopic perforation. Vascular/Lymphatic: No evidence of abdominal aortic aneurysm. No suspicious abdominopelvic lymphadenopathy. Reproductive: Prostate is unremarkable. Other: No abdominopelvic ascites. Musculoskeletal: Visualized osseous structures are within normal limits. IMPRESSION: Recurrent acute appendicitis. No drainable fluid collection/abscess. No free air. These results were called by telephone at the time of interpretation on 01/26/2017 at 10:45 pm to Dr. Jacqulyn BathLong, who verbally acknowledged these results.  Electronically Signed   By: Charline BillsSriyesh  Krishnan M.D.   On: 01/26/2017 22:47    Procedures Procedures (including critical care time)  Medications Ordered in ED Medications  cefTRIAXone (ROCEPHIN) 2 g in dextrose 5 % 50 mL IVPB (not administered)    And  metroNIDAZOLE (FLAGYL) IVPB 500 mg (not administered)  acetaminophen (TYLENOL) tablet 1,000 mg (not administered)  ondansetron (ZOFRAN-ODT) disintegrating tablet 4 mg (4 mg Oral Given 01/26/17 2002)  sodium chloride 0.9 % bolus 1,000 mL (0 mLs Intravenous Stopped 01/26/17 2238)  morphine 4 MG/ML injection 4 mg (4 mg Intravenous Given 01/26/17 2208)  iopamidol (ISOVUE-300) 61 % injection (100 mLs  Contrast Given 01/26/17 2152)     Initial Impression / Assessment and Plan / ED Course  I have reviewed the triage vital signs and the nursing notes.  Pertinent labs & imaging results that were available during my care of the patient were reviewed by me and considered in my medical decision making (see chart for details).     26 year old male presents to the emergency department for lower abdominal pain and associated nausea. Symptoms began at 8 AM and have been progressing throughout the day. Patient with history of appendiceal abscess requiring antibiotics and drainage in March 2018. Patient  elected to not have surgery at that time. CT today shows acute appendicitis. No evidence of perforation or abscess. Leukocytosis noted to 12.2. Antibiotics initiated and case discussed with Dr. Corliss Skains. Anticipate general surgery admission for operative management in the AM.   Final Clinical Impressions(s) / ED Diagnoses   Final diagnoses:  Acute appendicitis with localized peritonitis    New Prescriptions New Prescriptions   No medications on file     Antony Madura, Cordelia Poche 01/26/17 2302    Maia Plan, MD 01/29/17 [phone]

## 2017-01-26 NOTE — H&P (Signed)
Dakota Goodman is an 26 y.o. male.   Chief Complaint: RLQ abdominal pain HPI: This is a 26 yo male who is s/p acute appendicitis with periappendiceal abscess managed with a percutaneous drain in 3/18.  The drain was removed in April and interval appendectomy was discussed.  However, the decision was made to not proceed with surgery.  The patient developed lower abdominal pain this morning around 8 AM.  Mild nausea and soft bowel movements today.  Limited appetite.    Past Medical History:  Diagnosis Date  . Chronic back pain    "all over"  . Family history of adverse reaction to anesthesia    "sister could feel qthing when she woke up; had to get adrenaline for low heart rate" (09/09/2016)    Past Surgical History:  Procedure Laterality Date  . ABSCESS DRAINAGE  09/07/2016   Percutaneous drainage of periappendiceal abscess in IR    . IR RADIOLOGIST EVAL & MGMT  09/19/2016    No family history on file. Social History:  reports that he has never smoked. He has never used smokeless tobacco. He reports that he drinks alcohol. He reports that he does not use drugs.  Allergies:  Allergies  Allergen Reactions  . Other Other (See Comments)    No Dairy or Meat VEGAN  . Sulfa Drugs Cross Reactors Hives   Prior to Admission medications   Medication Sig Start Date End Date Taking? Authorizing Provider  Multiple Vitamin (MULTIVITAMIN WITH MINERALS) TABS tablet Take 1 tablet by mouth daily.   Yes [provider]  Probiotic Product (PROBIOTIC PO) Take 1 capsule by mouth daily.   Yes [provider]    Results for orders placed or performed during the hospital encounter of 01/26/17 (from the past 48 hour(s))  Lipase, blood     Status: None   Collection Time: 01/26/17  8:02 PM  Result Value Ref Range   Lipase 22 11 - 51 U/L  Comprehensive metabolic panel     Status: Abnormal   Collection Time: 01/26/17  8:02 PM  Result Value Ref Range   Sodium 139 135 - 145 mmol/L    Potassium 3.2 (L) 3.5 - 5.1 mmol/L   Chloride 105 101 - 111 mmol/L   CO2 25 22 - 32 mmol/L   Glucose, Bld 120 (H) 65 - 99 mg/dL   BUN 6 6 - 20 mg/dL   Creatinine, Ser 0.65 0.61 - 1.24 mg/dL   Calcium 8.6 (L) 8.9 - 10.3 mg/dL   Total Protein 6.3 (L) 6.5 - 8.1 g/dL   Albumin 4.2 3.5 - 5.0 g/dL   AST 20 15 - 41 U/L   ALT 15 (L) 17 - 63 U/L   Alkaline Phosphatase 87 38 - 126 U/L   Total Bilirubin 0.7 0.3 - 1.2 mg/dL   GFR calc non Af Amer >60 >60 mL/min   GFR calc Af Amer >60 >60 mL/min    Comment: (NOTE) The eGFR has been calculated using the CKD EPI equation. This calculation has not been validated in all clinical situations. eGFR's persistently <60 mL/min signify possible Chronic Kidney Disease.    Anion gap 9 5 - 15  CBC     Status: Abnormal   Collection Time: 01/26/17  8:02 PM  Result Value Ref Range   WBC 12.2 (H) 4.0 - 10.5 K/uL   RBC 5.19 4.22 - 5.81 MIL/uL   Hemoglobin 15.9 13.0 - 17.0 g/dL   HCT 46.6 39.0 - 52.0 %  MCV 89.8 78.0 - 100.0 fL   MCH 30.6 26.0 - 34.0 pg   MCHC 34.1 30.0 - 36.0 g/dL   RDW 12.8 11.5 - 15.5 %   Platelets 183 150 - 400 K/uL  Urinalysis, Routine w reflex microscopic     Status: Abnormal   Collection Time: 01/26/17  8:03 PM  Result Value Ref Range   Color, Urine YELLOW YELLOW   APPearance CLEAR CLEAR   Specific Gravity, Urine 1.020 1.005 - 1.030   pH 8.0 5.0 - 8.0   Glucose, UA NEGATIVE NEGATIVE mg/dL   Hgb urine dipstick NEGATIVE NEGATIVE   Bilirubin Urine NEGATIVE NEGATIVE   Ketones, ur NEGATIVE NEGATIVE mg/dL   Protein, ur 100 (A) NEGATIVE mg/dL   Nitrite NEGATIVE NEGATIVE   Leukocytes, UA NEGATIVE NEGATIVE   RBC / HPF 0-5 0 - 5 RBC/hpf   WBC, UA 0-5 0 - 5 WBC/hpf   Bacteria, UA NONE SEEN NONE SEEN   Squamous Epithelial / LPF NONE SEEN NONE SEEN   Mucous PRESENT   Differential     Status: Abnormal   Collection Time: 01/26/17 10:55 PM  Result Value Ref Range   Neutrophils Relative % 81 %   Neutro Abs 9.8 (H) 1.7 - 7.7 K/uL    Lymphocytes Relative 13 %   Lymphs Abs 1.6 0.7 - 4.0 K/uL   Monocytes Relative 6 %   Monocytes Absolute 0.7 0.1 - 1.0 K/uL   Eosinophils Relative 0 %   Eosinophils Absolute 0.0 0.0 - 0.7 K/uL   Basophils Relative 0 %   Basophils Absolute 0.0 0.0 - 0.1 K/uL   Ct Abdomen Pelvis W Contrast  Result Date: 01/26/2017 CLINICAL DATA:  Lower abdominal pain, prior periappendiceal abscess drainage EXAM: CT ABDOMEN AND PELVIS WITH CONTRAST TECHNIQUE: Multidetector CT imaging of the abdomen and pelvis was performed using the standard protocol following bolus administration of intravenous contrast. CONTRAST:  16m ISOVUE-300 IOPAMIDOL (ISOVUE-300) INJECTION 61% COMPARISON:  09/19/2016 FINDINGS: Lower chest: Lung bases are clear. Hepatobiliary: Liver is within normal limits. Gallbladder is underdistended but unremarkable. Pancreas: Within normal limits. Spleen: Within normal limits. Adrenals/Urinary Tract: Adrenal glands within normal limits. Excretory contrast in the bilateral collecting system. Kidneys are otherwise within normal limits. No hydronephrosis. Bladder is within normal limits. Stomach/Bowel: Stomach is within normal limits. No evidence of bowel obstruction. Dilated appendix, measuring 12 mm, with associated mucosal enhancement and surrounding periappendiceal inflammatory changes (series 3/ image 68), reflecting recurrent acute appendicitis. No drainable fluid collection/ abscess. No free air to suggest macroscopic perforation. Vascular/Lymphatic: No evidence of abdominal aortic aneurysm. No suspicious abdominopelvic lymphadenopathy. Reproductive: Prostate is unremarkable. Other: No abdominopelvic ascites. Musculoskeletal: Visualized osseous structures are within normal limits. IMPRESSION: Recurrent acute appendicitis. No drainable fluid collection/abscess. No free air. These results were called by telephone at the time of interpretation on 01/26/2017 at 10:45 pm to Dr. LLaverta Baltimore who verbally acknowledged these  results. Electronically Signed   By: SJulian HyM.D.   On: 01/26/2017 22:47    Review of Systems  Constitutional: Negative for weight loss.  HENT: Negative for ear discharge, ear pain, hearing loss and tinnitus.   Eyes: Negative for blurred vision, double vision, photophobia and pain.  Respiratory: Negative for cough, sputum production and shortness of breath.   Cardiovascular: Negative for chest pain.  Gastrointestinal: Positive for abdominal pain, diarrhea and nausea. Negative for vomiting.  Genitourinary: Negative for dysuria, flank pain, frequency and urgency.  Musculoskeletal: Negative for back pain, falls, joint pain, myalgias and neck pain.  Neurological: Negative for dizziness, tingling, sensory change, focal weakness, loss of consciousness and headaches.  Endo/Heme/Allergies: Does not bruise/bleed easily.  Psychiatric/Behavioral: Negative for depression, memory loss and substance abuse. The patient is not nervous/anxious.     Blood pressure (!) 124/51, pulse 95, temperature 98.8 F (37.1 C), temperature source Oral, resp. rate 16, height 6' 3" (1.905 m), weight 68 kg (150 lb), SpO2 98 %. Physical Exam  WDWN in NAD Eyes:  Pupils equal, round; sclera anicteric HENT:  Oral mucosa moist; good dentition; multiple piercings in face and ears Neck:  No masses palpated, no thyromegaly Lungs:  CTA bilaterally; normal respiratory effort CV:  Regular rate and rhythm; no murmurs; extremities well-perfused with no edema Abd:  +bowel sounds, soft, significantly tender in RLQ, mildly tender in left periumbilical region Skin:  Warm, dry; no sign of jaundice Psychiatric - alert and oriented x 4; calm mood and affect  Assessment/Plan Recurrent acute appendicitis - no sign of perforation or abscess  Admit for IV antibiotics, IV hydration Lap appy in AM by Dr. Rosendo Gros.   Maia Petties., MD 01/26/2017, 11:41 PM

## 2017-01-26 NOTE — ED Notes (Signed)
Pt instructed on being NPO after midnight.  Pt voices understanding

## 2017-01-26 NOTE — ED Notes (Signed)
Main lab notified to add on diff to CBC

## 2017-01-27 ENCOUNTER — Encounter (HOSPITAL_COMMUNITY): Payer: Self-pay | Admitting: Anesthesiology

## 2017-01-27 ENCOUNTER — Observation Stay (HOSPITAL_COMMUNITY): Payer: Self-pay | Admitting: Anesthesiology

## 2017-01-27 ENCOUNTER — Encounter (HOSPITAL_COMMUNITY): Admission: EM | Disposition: A | Discharge status: Home / Self Care | Payer: Self-pay | Source: Home / Self Care

## 2017-01-27 HISTORY — PX: LAPAROSCOPIC APPENDECTOMY: SHX408

## 2017-01-27 LAB — SURGICAL PCR SCREEN
MRSA, PCR: NEGATIVE
Staphylococcus aureus: NEGATIVE

## 2017-01-27 SURGERY — APPENDECTOMY, LAPAROSCOPIC
Anesthesia: General | Site: Abdomen

## 2017-01-27 MED ORDER — BUPIVACAINE HCL (PF) 0.25 % IJ SOLN
INTRAMUSCULAR | Status: AC
  Filled 2017-01-27: qty 30

## 2017-01-27 MED ORDER — ONDANSETRON 4 MG PO TBDP: 4.0000 mg | ORAL_TABLET | Freq: Four times a day (QID) | ORAL | Status: DC | PRN

## 2017-01-27 MED ORDER — ONDANSETRON HCL 4 MG/2ML IJ SOLN
INTRAMUSCULAR | Status: DC | PRN
  Administered 2017-01-27: 4 mg via INTRAVENOUS

## 2017-01-27 MED ORDER — 0.9 % SODIUM CHLORIDE (POUR BTL) OPTIME
TOPICAL | Status: DC | PRN
  Administered 2017-01-27: 1000 mL

## 2017-01-27 MED ORDER — FENTANYL CITRATE (PF) 250 MCG/5ML IJ SOLN
INTRAMUSCULAR | Status: AC
  Filled 2017-01-27: qty 5

## 2017-01-27 MED ORDER — POTASSIUM CHLORIDE IN NACL 20-0.9 MEQ/L-% IV SOLN
INTRAVENOUS | Status: DC
  Administered 2017-01-27 – 2017-01-28 (×2): via INTRAVENOUS
  Filled 2017-01-27 (×2): qty 1000

## 2017-01-27 MED ORDER — MIDAZOLAM HCL 2 MG/2ML IJ SOLN
INTRAMUSCULAR | Status: AC
  Filled 2017-01-27: qty 2

## 2017-01-27 MED ORDER — PROPOFOL 10 MG/ML IV BOLUS
INTRAVENOUS | Status: AC
  Filled 2017-01-27: qty 20

## 2017-01-27 MED ORDER — DIPHENHYDRAMINE HCL 25 MG PO CAPS: 25.0000 mg | ORAL_CAPSULE | Freq: Four times a day (QID) | ORAL | Status: DC | PRN

## 2017-01-27 MED ORDER — MIDAZOLAM HCL 5 MG/5ML IJ SOLN
INTRAMUSCULAR | Status: DC | PRN
  Administered 2017-01-27: 2 mg via INTRAVENOUS

## 2017-01-27 MED ORDER — OXYCODONE HCL 5 MG PO TABS
5.0000 mg | ORAL_TABLET | ORAL | Status: DC | PRN
  Administered 2017-01-27 – 2017-01-28 (×3): 10 mg via ORAL
  Filled 2017-01-27 (×3): qty 2

## 2017-01-27 MED ORDER — PHENYLEPHRINE 40 MCG/ML (10ML) SYRINGE FOR IV PUSH (FOR BLOOD PRESSURE SUPPORT)
PREFILLED_SYRINGE | INTRAVENOUS | Status: AC
  Filled 2017-01-27: qty 10

## 2017-01-27 MED ORDER — SUGAMMADEX SODIUM 200 MG/2ML IV SOLN
INTRAVENOUS | Status: DC | PRN
  Administered 2017-01-27: 200 mg via INTRAVENOUS

## 2017-01-27 MED ORDER — LIDOCAINE HCL (CARDIAC) 20 MG/ML IV SOLN
INTRAVENOUS | Status: DC | PRN
  Administered 2017-01-27: 100 mg via INTRAVENOUS

## 2017-01-27 MED ORDER — MEPERIDINE HCL 25 MG/ML IJ SOLN
6.2500 mg | INTRAMUSCULAR | Status: DC | PRN
Start: 2017-01-27 — End: 2017-01-27

## 2017-01-27 MED ORDER — DEXAMETHASONE SODIUM PHOSPHATE 10 MG/ML IJ SOLN
INTRAMUSCULAR | Status: DC | PRN
  Administered 2017-01-27: 10 mg via INTRAVENOUS

## 2017-01-27 MED ORDER — SUGAMMADEX SODIUM 200 MG/2ML IV SOLN
INTRAVENOUS | Status: AC
  Filled 2017-01-27: qty 2

## 2017-01-27 MED ORDER — LACTATED RINGERS IV SOLN
INTRAVENOUS | Status: DC
  Administered 2017-01-27: 08:00:00 via INTRAVENOUS

## 2017-01-27 MED ORDER — DIPHENHYDRAMINE HCL 50 MG/ML IJ SOLN: 25.0000 mg | Freq: Four times a day (QID) | INTRAMUSCULAR | Status: DC | PRN

## 2017-01-27 MED ORDER — ROCURONIUM BROMIDE 10 MG/ML (PF) SYRINGE
PREFILLED_SYRINGE | INTRAVENOUS | Status: AC
  Filled 2017-01-27: qty 5

## 2017-01-27 MED ORDER — LIDOCAINE 2% (20 MG/ML) 5 ML SYRINGE
INTRAMUSCULAR | Status: AC
  Filled 2017-01-27: qty 5

## 2017-01-27 MED ORDER — ONDANSETRON HCL 4 MG/2ML IJ SOLN: 4.0000 mg | Freq: Once | INTRAMUSCULAR | Status: DC | PRN

## 2017-01-27 MED ORDER — ENOXAPARIN SODIUM 40 MG/0.4ML ~~LOC~~ SOLN
40.0000 mg | SUBCUTANEOUS | Status: DC
  Administered 2017-01-27: 40 mg via SUBCUTANEOUS
  Filled 2017-01-27: qty 0.4

## 2017-01-27 MED ORDER — FENTANYL CITRATE (PF) 100 MCG/2ML IJ SOLN
INTRAMUSCULAR | Status: DC | PRN
  Administered 2017-01-27 (×2): 50 ug via INTRAVENOUS

## 2017-01-27 MED ORDER — SODIUM CHLORIDE 0.9 % IR SOLN
Status: DC | PRN
  Administered 2017-01-27: 1

## 2017-01-27 MED ORDER — ROCURONIUM BROMIDE 100 MG/10ML IV SOLN
INTRAVENOUS | Status: DC | PRN
  Administered 2017-01-27: 40 mg via INTRAVENOUS

## 2017-01-27 MED ORDER — ACETAMINOPHEN 500 MG PO TABS
1000.0000 mg | ORAL_TABLET | Freq: Four times a day (QID) | ORAL | Status: DC | PRN
  Administered 2017-01-27: 1000 mg via ORAL
  Filled 2017-01-27: qty 2

## 2017-01-27 MED ORDER — DEXAMETHASONE SODIUM PHOSPHATE 10 MG/ML IJ SOLN
INTRAMUSCULAR | Status: AC
  Filled 2017-01-27: qty 1

## 2017-01-27 MED ORDER — BUPIVACAINE HCL 0.25 % IJ SOLN
INTRAMUSCULAR | Status: DC | PRN
  Administered 2017-01-27: 5 mL

## 2017-01-27 MED ORDER — ONDANSETRON HCL 4 MG/2ML IJ SOLN
INTRAMUSCULAR | Status: AC
  Filled 2017-01-27: qty 2

## 2017-01-27 MED ORDER — DEXTROSE-NACL 5-0.9 % IV SOLN
INTRAVENOUS | Status: DC
  Administered 2017-01-27: 14:00:00 via INTRAVENOUS

## 2017-01-27 MED ORDER — METRONIDAZOLE IN NACL 5-0.79 MG/ML-% IV SOLN
500.0000 mg | Freq: Three times a day (TID) | INTRAVENOUS | Status: DC
  Administered 2017-01-27: 500 mg via INTRAVENOUS
  Filled 2017-01-27 (×2): qty 100

## 2017-01-27 MED ORDER — PHENYLEPHRINE 40 MCG/ML (10ML) SYRINGE FOR IV PUSH (FOR BLOOD PRESSURE SUPPORT)
PREFILLED_SYRINGE | INTRAVENOUS | Status: DC | PRN
  Administered 2017-01-27 (×2): 40 ug via INTRAVENOUS

## 2017-01-27 MED ORDER — MORPHINE SULFATE (PF) 4 MG/ML IV SOLN
2.0000 mg | INTRAVENOUS | Status: DC | PRN
  Administered 2017-01-27 (×3): 4 mg via INTRAVENOUS
  Filled 2017-01-27 (×3): qty 1

## 2017-01-27 MED ORDER — DEXTROSE 5 % IV SOLN: 2.0000 g | INTRAVENOUS | Status: DC

## 2017-01-27 MED ORDER — FENTANYL CITRATE (PF) 100 MCG/2ML IJ SOLN: 25.0000 ug | INTRAMUSCULAR | Status: DC | PRN

## 2017-01-27 MED ORDER — PROPOFOL 10 MG/ML IV BOLUS
INTRAVENOUS | Status: DC | PRN
  Administered 2017-01-27: 150 mg via INTRAVENOUS

## 2017-01-27 MED ORDER — ONDANSETRON HCL 4 MG/2ML IJ SOLN: 4.0000 mg | Freq: Four times a day (QID) | INTRAMUSCULAR | Status: DC | PRN

## 2017-01-27 SURGICAL SUPPLY — 41 items
APPLIER CLIP 5 13 M/L LIGAMAX5 (MISCELLANEOUS)
BENZOIN TINCTURE PRP APPL 2/3 (GAUZE/BANDAGES/DRESSINGS) ×3 IMPLANT
BLADE CLIPPER SURG (BLADE) ×3 IMPLANT
CANISTER SUCT 3000ML PPV (MISCELLANEOUS) ×3 IMPLANT
CHLORAPREP W/TINT 26ML (MISCELLANEOUS) ×3 IMPLANT
CLIP APPLIE 5 13 M/L LIGAMAX5 (MISCELLANEOUS) IMPLANT
CLOSURE WOUND 1/2 X4 (GAUZE/BANDAGES/DRESSINGS) ×1
COVER SURGICAL LIGHT HANDLE (MISCELLANEOUS) ×3 IMPLANT
COVER TRANSDUCER ULTRASND (DRAPES) ×3 IMPLANT
ELECT REM PT RETURN 9FT ADLT (ELECTROSURGICAL) ×3
ELECTRODE REM PT RTRN 9FT ADLT (ELECTROSURGICAL) ×1 IMPLANT
ENDOLOOP SUT PDS II  0 18 (SUTURE) ×6
ENDOLOOP SUT PDS II 0 18 (SUTURE) ×3 IMPLANT
GAUZE SPONGE 2X2 8PLY STRL LF (GAUZE/BANDAGES/DRESSINGS) ×1 IMPLANT
GLOVE BIO SURGEON STRL SZ7.5 (GLOVE) ×3 IMPLANT
GLOVE BIOGEL PI IND STRL 7.0 (GLOVE) ×2 IMPLANT
GLOVE BIOGEL PI INDICATOR 7.0 (GLOVE) ×4
GOWN STRL REUS W/ TWL LRG LVL3 (GOWN DISPOSABLE) ×2 IMPLANT
GOWN STRL REUS W/ TWL XL LVL3 (GOWN DISPOSABLE) ×1 IMPLANT
GOWN STRL REUS W/TWL LRG LVL3 (GOWN DISPOSABLE) ×4
GOWN STRL REUS W/TWL XL LVL3 (GOWN DISPOSABLE) ×2
GRASPER SUT TROCAR 14GX15 (MISCELLANEOUS) ×3 IMPLANT
KIT BASIN OR (CUSTOM PROCEDURE TRAY) ×3 IMPLANT
KIT ROOM TURNOVER OR (KITS) ×3 IMPLANT
NEEDLE INSUFFLATION 14GA 120MM (NEEDLE) ×3 IMPLANT
NS IRRIG 1000ML POUR BTL (IV SOLUTION) ×3 IMPLANT
PAD ARMBOARD 7.5X6 YLW CONV (MISCELLANEOUS) ×6 IMPLANT
SCISSORS LAP 5X35 DISP (ENDOMECHANICALS) ×3 IMPLANT
SET IRRIG TUBING LAPAROSCOPIC (IRRIGATION / IRRIGATOR) ×3 IMPLANT
SLEEVE ENDOPATH XCEL 5M (ENDOMECHANICALS) ×3 IMPLANT
SPECIMEN JAR SMALL (MISCELLANEOUS) ×3 IMPLANT
SPONGE GAUZE 2X2 STER 10/PKG (GAUZE/BANDAGES/DRESSINGS) ×2
STRIP CLOSURE SKIN 1/2X4 (GAUZE/BANDAGES/DRESSINGS) ×2 IMPLANT
SUT MNCRL AB 4-0 PS2 18 (SUTURE) ×3 IMPLANT
TOWEL OR 17X24 6PK STRL BLUE (TOWEL DISPOSABLE) ×3 IMPLANT
TOWEL OR 17X26 10 PK STRL BLUE (TOWEL DISPOSABLE) ×3 IMPLANT
TRAY FOLEY CATH SILVER 16FR (SET/KITS/TRAYS/PACK) ×3 IMPLANT
TRAY LAPAROSCOPIC MC (CUSTOM PROCEDURE TRAY) ×3 IMPLANT
TROCAR XCEL NON-BLD 11X100MML (ENDOMECHANICALS) ×3 IMPLANT
TROCAR XCEL NON-BLD 5MMX100MML (ENDOMECHANICALS) ×3 IMPLANT
TUBING INSUFFLATION (TUBING) ×3 IMPLANT

## 2017-01-27 NOTE — Addendum Note (Signed)
Addendum  created 01/27/17 1027 by Quentin OreWalker, Tajee Savant E, CRNA   Anesthesia Intra Flowsheets edited

## 2017-01-27 NOTE — Anesthesia Preprocedure Evaluation (Addendum)
Anesthesia Evaluation  Patient identified by MRN, date of birth, ID band Patient awake    Reviewed: Allergy & Precautions, NPO status , Patient's Chart, lab work & pertinent test results  Airway Mallampati: II  TM Distance: >3 FB Neck ROM: Full    Dental no notable dental hx. (+) Teeth Intact   Pulmonary neg pulmonary ROS,    Pulmonary exam normal breath sounds clear to auscultation       Cardiovascular negative cardio ROS Normal cardiovascular exam Rhythm:Regular Rate:Normal     Neuro/Psych negative neurological ROS  negative psych ROS   GI/Hepatic negative GI ROS, Neg liver ROS,   Endo/Other  negative endocrine ROS  Renal/GU negative Renal ROS  negative genitourinary   Musculoskeletal negative musculoskeletal ROS (+)   Abdominal   Peds negative pediatric ROS (+)  Hematology negative hematology ROS (+)   Anesthesia Other Findings   Reproductive/Obstetrics negative OB ROS                            Anesthesia Physical Anesthesia Plan  ASA: II  Anesthesia Plan: General   Post-op Pain Management:    Induction: Intravenous  PONV Risk Score and Plan: 2 and Ondansetron, Dexamethasone, Propofol infusion and Treatment may vary due to age or medical condition  Airway Management Planned: Oral ETT  Additional Equipment:   Intra-op Plan:   Post-operative Plan: Extubation in OR  Informed Consent:   Dental advisory given  Plan Discussed with:   Anesthesia Plan Comments: (  )        Anesthesia Quick Evaluation

## 2017-01-27 NOTE — Clinical Social Work Note (Signed)
CSW received inappropriate consult for "Patient needs financial assistance for obtaining medications." CSW informed RNCM Alesia to assist with this matter.   CSW signing off as no further Social Work needs identified. Please reconsult if new Social Work needs arise.   Corlis HoveJeneya Mattias Walmsley, LCSWA, LCASA Clinical Social Work [phone]

## 2017-01-27 NOTE — Progress Notes (Signed)
Patient arrived to floor from ED. Report received from Selena BattenKim, [place]. Patient alert and oriented x4. No complaint of pain. Significant other at bedside.

## 2017-01-27 NOTE — Anesthesia Postprocedure Evaluation (Signed)
Anesthesia Post Note  Patient: Dakota Goodman  Procedure(s) Performed: Procedure(s) (LRB): APPENDECTOMY LAPAROSCOPIC (N/A)     Patient location during evaluation: PACU Anesthesia Type: General Level of consciousness: awake and alert Pain management: pain level controlled Vital Signs Assessment: post-procedure vital signs reviewed and stable Respiratory status: spontaneous breathing, nonlabored ventilation, respiratory function stable and patient connected to nasal cannula oxygen Cardiovascular status: blood pressure returned to baseline and stable Postop Assessment: no signs of nausea or vomiting Anesthetic complications: no    Last Vitals:  Vitals:   01/27/17 0937 01/27/17 0950  BP: (!) 110/58 107/63  Pulse: 84 72  Resp: (!) 22 19  Temp:      Last Pain:  Vitals:   01/27/17 0757  TempSrc: Oral  PainSc:                  Bertine Schlottman

## 2017-01-27 NOTE — Discharge Instructions (Signed)
Laparoscopic Appendectomy, Adult, Care After °These instructions give you information about caring for yourself after your procedure. Your doctor may also give you more specific instructions. Call your doctor if you have any problems or questions after your procedure. °Follow these instructions at home: °Medicines °· Take over-the-counter and prescription medicines only as told by your doctor. °· Do not drive for 24 hours if you received a sedative. °· Do not drive or use heavy machinery while taking prescription pain medicine. °· If you were prescribed an antibiotic medicine, take it as told by your doctor. Do not stop taking it even if you start to feel better. °Activity °· Do not lift anything that is heavier than 10 pounds (4.5 kg) for 3 weeks or as told by your doctor. °· Do not play contact sports for 3 weeks or as told by your doctor. °· Slowly return to your normal activities. °Bathing °· Keep your cuts from surgery (incisions) clean and dry. °? Gently wash the cuts with soap and water. °? Rinse the cuts with water until the soap is gone. °? Pat the cuts dry with a clean towel. Do not rub the cuts. °· You may take showers after 48 hours. °· Do not take baths, swim, or use a hot tub for 2 weeks or as told by your doctor. °Cut Care °· Follow instructions from your doctor about how to take care of your cuts. Make sure you: °? Wash your hands with soap and water before you change your bandage (dressing). If you do not have soap and water, use hand sanitizer. °? Change your bandage as told by your doctor. °? Leave stitches (sutures), skin glue, or skin tape (adhesive) strips in place. They may need to stay in place for 2 weeks or longer. If tape strips get loose and curl up, you may trim the loose edges. Do not remove tape strips completely unless your doctor says it is okay. °· Check your cuts every day for signs of infection. Check for: °? More redness, swelling, or pain. °? More fluid or  blood. °? Warmth. °? Pus or a bad smell. °Other Instructions °· If you were sent home with a drain, follow instructions from your doctor about how to use it and care for it. °· Take deep breaths. This helps to keep your lungs from getting swollen (inflamed). °· To help with constipation: °? Drink plenty of fluids. °? Eat plenty of fruits and vegetables. °· Keep all follow-up visits as told by your doctor. This is important. °Contact a doctor if: °· You have more redness, swelling, or pain around a cut from surgery. °· You have more fluid or blood coming from a cut. °· Your cut feels warm to the touch. °· You have pus or a bad smell coming from a cut or a bandage. °· The edges of a cut break open after the stitches have been taken out. °· You have pain in your shoulders that gets worse. °· You feel dizzy or you pass out (faint). °· You have shortness of breath. °· You keep feeling sick to your stomach (nauseous). °· You keep throwing up (vomiting). °· You get diarrhea or you cannot control your poop. °· You lose your appetite. °· You have swelling or pain in your legs. °Get help right away if: °· You have a fever. °· You get a rash. °· You have trouble breathing. °· You have sharp pains in your chest. °This information is not intended to replace advice given   to you by your health care provider. Make sure you discuss any questions you have with your health care provider. °Document Released: 04/13/2009 Document Revised: 11/23/2015 Document Reviewed: 12/05/2014 °Elsevier Interactive Patient Education © 2018 [employer] ° °CCS ______[facility] °LAPAROSCOPIC SURGERY: POST OP INSTRUCTIONS °Always review your discharge instruction sheet given to you by the facility where your surgery was performed. °IF YOU HAVE DISABILITY OR FAMILY LEAVE FORMS, YOU MUST BRING THEM TO THE OFFICE FOR PROCESSING.   °DO NOT GIVE THEM TO YOUR DOCTOR. ° °1. A prescription for pain medication may be given to you upon  discharge.  Take your pain medication as prescribed, if needed.  If narcotic pain medicine is not needed, then you may take acetaminophen (Tylenol) or ibuprofen (Advil) as needed. °2. Take your usually prescribed medications unless otherwise directed. °3. If you need a refill on your pain medication, please contact your pharmacy.  They will contact our office to request authorization. Prescriptions will not be filled after 5pm or on week-ends. °4. You should follow a light diet the first few days after arrival home, such as soup and crackers, etc.  Be sure to include lots of fluids daily. °5. Most patients will experience some swelling and bruising in the area of the incisions.  Ice packs will help.  Swelling and bruising can take several days to resolve.  °6. It is common to experience some constipation if taking pain medication after surgery.  Increasing fluid intake and taking a stool softener (such as Colace) will usually help or prevent this problem from occurring.  A mild laxative (Milk of Magnesia or Miralax) should be taken according to package instructions if there are no bowel movements after 48 hours. °7. Unless discharge instructions indicate otherwise, you may remove your bandages 24-48 hours after surgery, and you may shower at that time.  You may have steri-strips (small skin tapes) in place directly over the incision.  These strips should be left on the skin for 7-10 days.  If your surgeon used skin glue on the incision, you may shower in 24 hours.  The glue will flake off over the next 2-3 weeks.  Any sutures or staples will be removed at the office during your follow-up visit. °8. ACTIVITIES:  You may resume regular (light) daily activities beginning the next day--such as daily self-care, walking, climbing stairs--gradually increasing activities as tolerated.  You may have sexual intercourse when it is comfortable.  Refrain from any heavy lifting or straining until approved by your doctor. °a. You  may drive when you are no longer taking prescription pain medication, you can comfortably wear a seatbelt, and you can safely maneuver your car and apply brakes. °b. RETURN TO WORK:  __________________________________________________________ °9. You should see your doctor in the office for a follow-up appointment approximately 2-3 weeks after your surgery.  Make sure that you call for this appointment within a day or two after you arrive home to insure a convenient appointment time. °10. OTHER INSTRUCTIONS: __________________________________________________________________________________________________________________________ __________________________________________________________________________________________________________________________ °WHEN TO CALL YOUR DOCTOR: °1. Fever over 101.0 °2. Inability to urinate °3. Continued bleeding from incision. °4. Increased pain, redness, or drainage from the incision. °5. Increasing abdominal pain ° °The clinic staff is available to answer your questions during regular business hours.  Please don’t hesitate to call and ask to speak to one of the nurses for clinical concerns.  If you have a medical emergency, go to the nearest emergency room or call 911.  A surgeon from   [facility] is always on call at the hospital. °[address], [address], [place], [place]  [address] ? [address] [address], [place], [place]   [address] °[phone] ? [phone] ? FAX [phone] °Web site: [email or web address] ° °

## 2017-01-27 NOTE — Op Note (Signed)
01/27/2017  9:15 AM  PATIENT:  Dakota Goodman  26 y.o. male  PRE-OPERATIVE DIAGNOSIS:  Acute Appendicitis  POST-OPERATIVE DIAGNOSIS:  Acute, non-perforated Appendicitis  PROCEDURE:  Procedure(s): APPENDECTOMY LAPAROSCOPIC (N/A)  SURGEON:  Surgeon(s) and Role:    Axel Filler* Joselyne Spake, MD - Primary  ANESTHESIA:   local and general  EBL:  <5cc  BLOOD ADMINISTERED:none  DRAINS: none   LOCAL MEDICATIONS USED:  BUPIVICAINE   SPECIMEN:  Source of Specimen:  appendix  DISPOSITION OF SPECIMEN:  PATHOLOGY  COUNTS:  YES  TOURNIQUET:  * No tourniquets in log *  DICTATION: .Dragon Dictation Complications: none  Counts: reported as correct x 2  Findings:  The patient had a acutely inflammed, non perforated appendix  Specimen: Appendix  Indications for procedure:  The patient is a [age] year old male with a history of periumbilical pain localized in the right lower quadrant patient had a CT scan which revealed signs consistent with acute appendicitis the patient back in for laparoscopic appendectomy.  Pt did have a previous h/o perforated appendix with drain placement.  Details of the procedure:  The patient was taken back to the operating room. The patient was placed in supine position with bilateral SCDs in place.  A foley catheter was place. The patient was prepped and draped in the usual sterile fashion.  After appropriate anitbiotics were confirmed, a time-out was confirmed and all facts were verified.    A pneumoperitoneum of 14 mmHg was obtained via a Veress needle technique in the left lower quadrant quadrant.  A 5 mm trocar and 5 mm camera then placed intra-abdominally there is no injury to any intra-abdominal organs a 10 mm infraumbilical port was placed and direct visualization as was a 5 mm port in the suprapubic area.   The appendix was identified and seen to be non-perforated. There was some inflamatory adhesions to the appendix that were taken down bluntly. The  appendix was cleaned down to the appendiceal base. The mesoappendix was then incised and the appendiceal artery was cauterized.  The the appendiceal base was clean.  At this time an Endoloop was placed proximallyx2 and one distally and the appendix was transected between these 2. A retrieval bag was then placed into the abdomen and the specimen placed in the bag. The appendiceal stump was cauterized. We evacuate the fluid from the pelvis until the effluent was clear.  The appendix and retrieval  bag was then retrieved via the supraumbilical port. #1 Vicryl was used to reapproximate the fascia at the umbilical port site x1. The skin was reapproximated all port sites 3-0 Monocryl subcuticular fashion. The skin was dressed with steri-strips, guaze, and tape.  The patient had the foley removed. The patient was awakened from general anesthesia was taken to recovery room in stable condition.      PLAN OF CARE: Admit for overnight observation  PATIENT DISPOSITION:  PACU - hemodynamically stable.   Delay start of Pharmacological VTE agent (>24hrs) due to surgical blood loss or risk of bleeding: no

## 2017-01-27 NOTE — Transfer of Care (Signed)
Immediate Anesthesia Transfer of Care Note  Patient: Dakota Goodman  Procedure(s) Performed: Procedure(s): APPENDECTOMY LAPAROSCOPIC (N/A)  Patient Location: PACU  Anesthesia Type:General  Level of Consciousness: awake, alert  and oriented  Airway & Oxygen Therapy: Patient Spontanous Breathing and Patient connected to nasal cannula oxygen  Post-op Assessment: Report given to RN, Post -op Vital signs reviewed and stable and Patient moving all extremities X 4  Post vital signs: Reviewed and stable  Last Vitals:  Vitals:   01/27/17 0457 01/27/17 0757  BP: (!) 104/51 108/62  Pulse: 91 79  Resp: 19 18  Temp: 37.1 C 37.1 C    Last Pain:  Vitals:   01/27/17 0757  TempSrc: Oral  PainSc:          Complications: No apparent anesthesia complications

## 2017-01-27 NOTE — Anesthesia Procedure Notes (Signed)
Procedure Name: Intubation Date/Time: 01/27/2017 8:43 AM Performed by: Kyung Rudd Pre-anesthesia Checklist: Patient identified, Emergency Drugs available, Suction available and Patient being monitored Patient Re-evaluated:Patient Re-evaluated prior to induction Oxygen Delivery Method: Circle system utilized Preoxygenation: Pre-oxygenation with 100% oxygen Induction Type: IV induction Ventilation: Mask ventilation without difficulty Laryngoscope Size: Mac and 3 Grade View: Grade II Tube type: Oral Tube size: 7.5 mm Number of attempts: 1 Airway Equipment and Method: Stylet Placement Confirmation: ETT inserted through vocal cords under direct vision,  positive ETCO2 and breath sounds checked- equal and bilateral Secured at: 21 cm Tube secured with: Tape Dental Injury: Teeth and Oropharynx as per pre-operative assessment

## 2017-01-27 NOTE — Progress Notes (Signed)
Subjective/Chief Complaint: Pt with con't pain RLQ No n/v   Objective: Vital signs in last 24 hours: Temp:  [98.1 F (36.7 C)-100.3 F (37.9 C)] 98.7 F (37.1 C) (07/30 0457) Pulse Rate:  [75-113] 91 (07/30 0457) Resp:  [16-19] 19 (07/30 0457) BP: (97-147)/(51-83) 104/51 (07/30 0457) SpO2:  [96 %-100 %] 98 % (07/30 0457) Weight:  [68 kg (150 lb)-70 kg (154 lb 5.2 oz)] 70 kg (154 lb 5.2 oz) (07/30 0102) Last BM Date: 01/26/17  Intake/Output from previous day: 07/29 0701 - 07/30 0700 In: 1050 [I.V.:50; IV Piggyback:1000] Out: 780 [Urine:780] Intake/Output this shift: No intake/output data recorded.  Constitutional: No acute distress, conversant, appears states age. Eyes: Anicteric sclerae, moist conjunctiva, no lid lag Lungs: Clear to auscultation bilaterally, normal respiratory effort CV: regular rate and rhythm, no murmurs, no peripheral edema, pedal pulses 2+ GI: Soft, no masses or hepatosplenomegaly, TTP RLQ Skin: No rashes, palpation reveals normal turgor Psychiatric: appropriate judgment and insight, oriented to person, place, and time   Lab Results:   Recent Labs  01/26/17 2002  WBC 12.2*  HGB 15.9  HCT 46.6  PLT 183   BMET  Recent Labs  01/26/17 2002  NA 139  K 3.2*  CL 105  CO2 25  GLUCOSE 120*  BUN 6  CREATININE 0.65  CALCIUM 8.6*   PT/INR No results for input(s): LABPROT, INR in the last 72 hours. ABG No results for input(s): PHART, HCO3 in the last 72 hours.  Invalid input(s): PCO2, PO2  Studies/Results: Ct Abdomen Pelvis W Contrast  Result Date: 01/26/2017 CLINICAL DATA:  Lower abdominal pain, prior periappendiceal abscess drainage EXAM: CT ABDOMEN AND PELVIS WITH CONTRAST TECHNIQUE: Multidetector CT imaging of the abdomen and pelvis was performed using the standard protocol following bolus administration of intravenous contrast. CONTRAST:  100mL ISOVUE-300 IOPAMIDOL (ISOVUE-300) INJECTION 61% COMPARISON:  09/19/2016 FINDINGS: Lower  chest: Lung bases are clear. Hepatobiliary: Liver is within normal limits. Gallbladder is underdistended but unremarkable. Pancreas: Within normal limits. Spleen: Within normal limits. Adrenals/Urinary Tract: Adrenal glands within normal limits. Excretory contrast in the bilateral collecting system. Kidneys are otherwise within normal limits. No hydronephrosis. Bladder is within normal limits. Stomach/Bowel: Stomach is within normal limits. No evidence of bowel obstruction. Dilated appendix, measuring 12 mm, with associated mucosal enhancement and surrounding periappendiceal inflammatory changes (series 3/ image 68), reflecting recurrent acute appendicitis. No drainable fluid collection/ abscess. No free air to suggest macroscopic perforation. Vascular/Lymphatic: No evidence of abdominal aortic aneurysm. No suspicious abdominopelvic lymphadenopathy. Reproductive: Prostate is unremarkable. Other: No abdominopelvic ascites. Musculoskeletal: Visualized osseous structures are within normal limits. IMPRESSION: Recurrent acute appendicitis. No drainable fluid collection/abscess. No free air. These results were called by telephone at the time of interpretation on 01/26/2017 at 10:45 pm to Dr. Jacqulyn BathLong, who verbally acknowledged these results. Electronically Signed   By: Charline BillsSriyesh  Krishnan M.D.   On: 01/26/2017 22:47    Anti-infectives: Anti-infectives    Start     Dose/Rate Route Frequency Ordered Stop   01/27/17 2200  cefTRIAXone (ROCEPHIN) 2 g in dextrose 5 % 50 mL IVPB     2 g 100 mL/hr over 30 Minutes Intravenous Every 24 hours 01/27/17 0102     01/27/17 0600  metroNIDAZOLE (FLAGYL) IVPB 500 mg     500 mg 100 mL/hr over 60 Minutes Intravenous Every 8 hours 01/27/17 0102     01/26/17 2300  cefTRIAXone (ROCEPHIN) 2 g in dextrose 5 % 50 mL IVPB     2 g 100  mL/hr over 30 Minutes Intravenous  Once 01/26/17 2251 01/27/17 0000   01/26/17 2300  metroNIDAZOLE (FLAGYL) IVPB 500 mg     500 mg 100 mL/hr over 60 Minutes  Intravenous  Once 01/26/17 2251 01/27/17 0059      Assessment/Plan: 26 y/o M with recurrent appendicitis To OR for Lap vs, open appendectomy I discussed with the patient the risks benefits of the procedure to include but not limited to: Infection, bleeding, damage to surrounding structures, possible ileus, possible postoperative infection. Patient voiced understanding and wishes to proceed.   LOS: 0 days    Marigene EhlersRamirez Jr., Jed LimerickArmando 01/27/2017

## 2017-01-28 ENCOUNTER — Encounter (HOSPITAL_COMMUNITY): Payer: Self-pay | Admitting: General Surgery

## 2017-01-28 LAB — URINALYSIS, ROUTINE W REFLEX MICROSCOPIC
BACTERIA UA: NONE SEEN
Bilirubin Urine: NEGATIVE
GLUCOSE, UA: NEGATIVE mg/dL
KETONES UR: 20 mg/dL — AB
LEUKOCYTES UA: NEGATIVE
NITRITE: NEGATIVE
PROTEIN: NEGATIVE mg/dL
Specific Gravity, Urine: 1.016 (ref 1.005–1.030)
Squamous Epithelial / LPF: NONE SEEN
pH: 6 (ref 5.0–8.0)

## 2017-01-28 MED ORDER — MORPHINE SULFATE (PF) 4 MG/ML IV SOLN: 2.0000 mg | INTRAVENOUS | Status: DC | PRN

## 2017-01-28 MED ORDER — IBUPROFEN 600 MG PO TABS
600.0000 mg | ORAL_TABLET | Freq: Three times a day (TID) | ORAL | Status: DC
Start: 2017-01-28 — End: 2017-01-28
  Administered 2017-01-28: 600 mg via ORAL
  Filled 2017-01-28: qty 1

## 2017-01-28 MED ORDER — OXYCODONE HCL 5 MG PO TABS: 5.0000 mg | ORAL_TABLET | Freq: Four times a day (QID) | ORAL | 0 refills | Status: DC | PRN

## 2017-01-28 MED ORDER — ACETAMINOPHEN 325 MG PO TABS: 650.0000 mg | ORAL_TABLET | Freq: Four times a day (QID) | ORAL | Status: DC

## 2017-01-28 NOTE — Discharge Summary (Signed)
[facility] Discharge Summary   Patient ID: Dakota Goodman MRN: [id number] DOB/AGE: [birth year] 26 y.o.  Admit date: 01/26/2017 Discharge date: 01/28/2017  Admitting Diagnosis: Acute appendicitis  Discharge Diagnosis Patient Active Problem List   Diagnosis Date Noted  . Acute appendicitis 01/26/2017  . Perforated appendicitis 09/07/2016    Consultants None  Imaging: Ct Abdomen Pelvis W Contrast  Result Date: 01/26/2017 CLINICAL DATA:  Lower abdominal pain, prior periappendiceal abscess drainage EXAM: CT ABDOMEN AND PELVIS WITH CONTRAST TECHNIQUE: Multidetector CT imaging of the abdomen and pelvis was performed using the standard protocol following bolus administration of intravenous contrast. CONTRAST:  100mL ISOVUE-300 IOPAMIDOL (ISOVUE-300) INJECTION 61% COMPARISON:  09/19/2016 FINDINGS: Lower chest: Lung bases are clear. Hepatobiliary: Liver is within normal limits. Gallbladder is underdistended but unremarkable. Pancreas: Within normal limits. Spleen: Within normal limits. Adrenals/Urinary Tract: Adrenal glands within normal limits. Excretory contrast in the bilateral collecting system. Kidneys are otherwise within normal limits. No hydronephrosis. Bladder is within normal limits. Stomach/Bowel: Stomach is within normal limits. No evidence of bowel obstruction. Dilated appendix, measuring 12 mm, with associated mucosal enhancement and surrounding periappendiceal inflammatory changes (series 3/ image 68), reflecting recurrent acute appendicitis. No drainable fluid collection/ abscess. No free air to suggest macroscopic perforation. Vascular/Lymphatic: No evidence of abdominal aortic aneurysm. No suspicious abdominopelvic lymphadenopathy. Reproductive: Prostate is unremarkable. Other: No abdominopelvic ascites. Musculoskeletal: Visualized osseous structures are within normal limits. IMPRESSION: Recurrent acute appendicitis. No drainable fluid collection/abscess. No free air.  These results were called by telephone at the time of interpretation on 01/26/2017 at 10:45 pm to Dr. Jacqulyn BathLong, who verbally acknowledged these results. Electronically Signed   By: Charline BillsSriyesh  Krishnan M.D.   On: 01/26/2017 22:47    Procedures Dr. Derrell Lollingamirez (01/27/17) - Laparoscopic Appendectomy  Hospital Course:  Dakota Goodman is a 26yo male who [facility] with acute onset lower abdominal pain. Prior h/o acute appendicitis with periappendiceal abscess managed with a percutaneous drain in 3/18; the drain was removed in April and interval appendectomy was discussed, however the decision was made to not proceed with surgery.   Workup showed recurrent acute appendicitis without rupture or abscess.  Patient was admitted and underwent procedure listed above.  Tolerated procedure well and was transferred to the floor.  Diet was advanced as tolerated.  On POD1 the patient was voiding well, tolerating diet, ambulating well, pain well controlled, vital signs stable, incisions c/d/i and felt stable for discharge home.  Patient will follow up in our office in 2 weeks and knows to call with questions or concerns.   I have personally reviewed the patients medication history on the [place] controlled substance database.    Physical Exam: Gen:  Alert, NAD, pleasant HEENT: EOM's intact, pupils equal and round Card:  RRR, no M/G/R heard Pulm:  CTAB, no W/R/R, effort normal Abd: Soft, ND, appropriately tender, +BS, incisions covered with clean/dry dressings Ext:  No erythema, edema, or tenderness BUE/BLE  Psych: A&Ox3  Skin: no rashes noted, warm and dry  Allergies as of 01/28/2017      Reactions   Other Other (See Comments)   No Dairy or Meat VEGAN   Sulfa Drugs Cross Reactors Hives      Medication List    TAKE these medications   multivitamin with minerals Tabs tablet Take 1 tablet by mouth daily.   oxyCODONE 5 MG immediate release tablet Commonly known as:  Oxy IR/ROXICODONE Take 1-2 tablets  (5-10 mg total) by mouth every 6 (six) hours  as needed for moderate pain.   PROBIOTIC PO Take 1 capsule by mouth daily.        Follow-up Information    [facility] Follow up on 02/11/2017.   Specialty:  General Surgery Why:  Your appointment is at 9:45 AM, be at the office 30 minutes early for check in.  Occupational psychologistBring insurance and photo ID. Contact information: [address] [place] [address] [phone]           Signed: Franne FortsBROOKE A MEUTH, [facility] 01/28/2017, 12:57 PM Pager: [phone] Consults: [phone] Mon-Fri 7:00 am-4:30 pm Sat-Sun 7:00 am-11:30 am

## 2017-01-28 NOTE — Progress Notes (Addendum)
Pt was ambulating in the room. Abd lap sites with dressing dry and intact. Pain is controlled. Urinating w/o difficulty. Discharge instructions given to pt, verbalized understanding. Discharged to home accompanied by family.

## 2017-01-28 NOTE — Progress Notes (Signed)
Patient ID: Dakota Goodman, male   DOB: 29-Mar-1991, 26 y.o.   MRN: [id number]  [facility]  1 Day Post-Op  Subjective: CC- appendicitis Main complaint this morning is dysuria. Denies hematuria. Also states that he abdomen is very sore, feels like tylenol helps more than oxycodone or morphine. Has not ambulated much due to pain.  He is tolerating a regular diet. Denies n/v. Passing flatus.  Objective: Vital signs in last 24 hours: Temp:  [98.1 F (36.7 C)-100 F (37.8 C)] 100 F (37.8 C) (07/31 0512) Pulse Rate:  [60-94] 83 (07/31 0512) Resp:  [14-24] 18 (07/31 0512) BP: (98-114)/(46-63) 114/58 (07/31 0512) SpO2:  [96 %-100 %] 100 % (07/31 0512) Last BM Date: 01/26/17  Intake/Output from previous day: 07/30 0701 - 07/31 0700 In: 2089 [P.O.:960; I.V.:1129] Out: 1270 [Urine:1260; Blood:10] Intake/Output this shift: No intake/output data recorded.  PE: Gen:  Alert, NAD, pleasant HEENT: EOM's intact, pupils equal and round Card:  RRR, no M/G/R heard Pulm:  CTAB, no W/R/R, effort normal Abd: Soft, ND, appropriately tender, +BS, incisions covered with clean/dry dressings Ext:  No erythema, edema, or tenderness BUE/BLE  Psych: A&Ox3  Skin: no rashes noted, warm and dry  Lab Results:   Recent Labs  01/26/17 2002  WBC 12.2*  HGB 15.9  HCT 46.6  PLT 183   BMET  Recent Labs  01/26/17 2002  NA 139  K 3.2*  CL 105  CO2 25  GLUCOSE 120*  BUN 6  CREATININE 0.65  CALCIUM 8.6*   PT/INR No results for input(s): LABPROT, INR in the last 72 hours. CMP     Component Value Date/Time   NA 139 01/26/2017 2002   K 3.2 (L) 01/26/2017 2002   CL 105 01/26/2017 2002   CO2 25 01/26/2017 2002   GLUCOSE 120 (H) 01/26/2017 2002   BUN 6 01/26/2017 2002   CREATININE 0.65 01/26/2017 2002   CALCIUM 8.6 (L) 01/26/2017 2002   PROT 6.3 (L) 01/26/2017 2002   ALBUMIN 4.2 01/26/2017 2002   AST 20 01/26/2017 2002   ALT 15 (L) 01/26/2017 2002   ALKPHOS  87 01/26/2017 2002   BILITOT 0.7 01/26/2017 2002   GFRNONAA >60 01/26/2017 2002   GFRAA >60 01/26/2017 2002   Lipase     Component Value Date/Time   LIPASE 22 01/26/2017 2002       Studies/Results: Ct Abdomen Pelvis W Contrast  Result Date: 01/26/2017 CLINICAL DATA:  Lower abdominal pain, prior periappendiceal abscess drainage EXAM: CT ABDOMEN AND PELVIS WITH CONTRAST TECHNIQUE: Multidetector CT imaging of the abdomen and pelvis was performed using the standard protocol following bolus administration of intravenous contrast. CONTRAST:  100mL ISOVUE-300 IOPAMIDOL (ISOVUE-300) INJECTION 61% COMPARISON:  09/19/2016 FINDINGS: Lower chest: Lung bases are clear. Hepatobiliary: Liver is within normal limits. Gallbladder is underdistended but unremarkable. Pancreas: Within normal limits. Spleen: Within normal limits. Adrenals/Urinary Tract: Adrenal glands within normal limits. Excretory contrast in the bilateral collecting system. Kidneys are otherwise within normal limits. No hydronephrosis. Bladder is within normal limits. Stomach/Bowel: Stomach is within normal limits. No evidence of bowel obstruction. Dilated appendix, measuring 12 mm, with associated mucosal enhancement and surrounding periappendiceal inflammatory changes (series 3/ image 68), reflecting recurrent acute appendicitis. No drainable fluid collection/ abscess. No free air to suggest macroscopic perforation. Vascular/Lymphatic: No evidence of abdominal aortic aneurysm. No suspicious abdominopelvic lymphadenopathy. Reproductive: Prostate is unremarkable. Other: No abdominopelvic ascites. Musculoskeletal: Visualized osseous structures are within normal limits. IMPRESSION: Recurrent acute appendicitis. No drainable  fluid collection/abscess. No free air. These results were called by telephone at the time of interpretation on 01/26/2017 at 10:45 pm to Dr. Jacqulyn BathLong, who verbally acknowledged these results. Electronically Signed   By: Charline BillsSriyesh  Krishnan  M.D.   On: 01/26/2017 22:47    Anti-infectives: Anti-infectives    Start     Dose/Rate Route Frequency Ordered Stop   01/27/17 2200  cefTRIAXone (ROCEPHIN) 2 g in dextrose 5 % 50 mL IVPB  Status:  Discontinued     2 g 100 mL/hr over 30 Minutes Intravenous Every 24 hours 01/27/17 0102 01/27/17 1030   01/27/17 0600  metroNIDAZOLE (FLAGYL) IVPB 500 mg  Status:  Discontinued     500 mg 100 mL/hr over 60 Minutes Intravenous Every 8 hours 01/27/17 0102 01/27/17 1030   01/26/17 2300  cefTRIAXone (ROCEPHIN) 2 g in dextrose 5 % 50 mL IVPB     2 g 100 mL/hr over 30 Minutes Intravenous  Once 01/26/17 2251 01/27/17 0000   01/26/17 2300  metroNIDAZOLE (FLAGYL) IVPB 500 mg     500 mg 100 mL/hr over 60 Minutes Intravenous  Once 01/26/17 2251 01/27/17 0059       Assessment/Plan Acute appendicitis S/p lap appy 7/30 Dr. Meribeth Mattesamire - POD 1  ID - rocephin/flagyl 7/29>>7/30 FEN - regular diet VTE - SCDs, lovenox  Plan - add scheduled tylenol/ibuprofen for better pain control. Check UA.  Will likely be ready for discharge later today. He will follow-up in [facility] clinic in 2-3 weeks, and does not need to go home on antibiotics.   LOS: 1 day    Franne FortsBROOKE A MEUTH , [facility] 01/28/2017, 7:57 AM Pager: [phone] Consults: [phone] Mon-Fri 7:00 am-4:30 pm Sat-Sun 7:00 am-11:30 am

## 2017-01-30 ENCOUNTER — Encounter (HOSPITAL_COMMUNITY): Payer: Self-pay | Admitting: Emergency Medicine

## 2017-01-30 DIAGNOSIS — G8918 Other acute postprocedural pain: Secondary | ICD-10-CM | POA: Diagnosis present

## 2017-01-30 DIAGNOSIS — A0472 Enterocolitis due to Clostridium difficile, not specified as recurrent: Principal | ICD-10-CM | POA: Diagnosis present

## 2017-01-30 LAB — COMPREHENSIVE METABOLIC PANEL
ALBUMIN: 3.5 g/dL (ref 3.5–5.0)
ALK PHOS: 77 U/L (ref 38–126)
ALT: 13 U/L — ABNORMAL LOW (ref 17–63)
ANION GAP: 10 (ref 5–15)
AST: 14 U/L — AB (ref 15–41)
BILIRUBIN TOTAL: 0.9 mg/dL (ref 0.3–1.2)
BUN: 9 mg/dL (ref 6–20)
CALCIUM: 9.1 mg/dL (ref 8.9–10.3)
CHLORIDE: 100 mmol/L — AB (ref 101–111)
CO2: 26 mmol/L (ref 22–32)
CREATININE: 0.69 mg/dL (ref 0.61–1.24)
GFR calc Af Amer: >60 mL/min (ref >60)
GLUCOSE: 114 mg/dL — AB (ref 65–99)
Potassium: 3.6 mmol/L (ref 3.5–5.1)
Sodium: 136 mmol/L (ref 135–145)
Total Protein: 6.8 g/dL (ref 6.5–8.1)

## 2017-01-30 LAB — CBC
HCT: 46.1 % (ref 39.0–52.0)
HEMOGLOBIN: 16.3 g/dL (ref 13.0–17.0)
MCH: 31.5 pg (ref 26.0–34.0)
MCHC: 35.4 g/dL (ref 30.0–36.0)
MCV: 89 fL (ref 78.0–100.0)
Platelets: 206 10*3/uL (ref 150–400)
RBC: 5.18 MIL/uL (ref 4.22–5.81)
RDW: 12.8 % (ref 11.5–15.5)
WBC: 9.1 10*3/uL (ref 4.0–10.5)

## 2017-01-30 LAB — URINALYSIS, ROUTINE W REFLEX MICROSCOPIC
Glucose, UA: 50 mg/dL — AB
Hgb urine dipstick: NEGATIVE
KETONES UR: 80 mg/dL — AB
Leukocytes, UA: NEGATIVE
Nitrite: NEGATIVE
PROTEIN: 100 mg/dL — AB
Specific Gravity, Urine: 1.032 — ABNORMAL HIGH (ref 1.005–1.030)
pH: 6 (ref 5.0–8.0)

## 2017-01-30 LAB — LIPASE, BLOOD: Lipase: 18 U/L (ref 11–51)

## 2017-01-30 NOTE — ED Triage Notes (Signed)
Pt reports having an appendectomy on Monday, increased abdominal pain, nausea, vomiting, and diarrhea beginning today.

## 2017-01-31 ENCOUNTER — Inpatient Hospital Stay (HOSPITAL_COMMUNITY)
Admission: EM | Admit: 2017-01-31 | Discharge: 2017-02-03 | DRG: 373 | Disposition: A | Discharge status: Home / Self Care | Payer: Self-pay | Attending: General Surgery | Admitting: General Surgery

## 2017-01-31 ENCOUNTER — Emergency Department (HOSPITAL_COMMUNITY): Payer: Self-pay

## 2017-01-31 DIAGNOSIS — R1115 Cyclical vomiting syndrome unrelated to migraine: Secondary | ICD-10-CM

## 2017-01-31 DIAGNOSIS — K529 Noninfective gastroenteritis and colitis, unspecified: Secondary | ICD-10-CM | POA: Diagnosis present

## 2017-01-31 DIAGNOSIS — G8918 Other acute postprocedural pain: Secondary | ICD-10-CM

## 2017-01-31 LAB — C DIFFICILE QUICK SCREEN W PCR REFLEX
C DIFFICLE (CDIFF) ANTIGEN: POSITIVE — AB
C Diff interpretation: DETECTED
C Diff toxin: POSITIVE — AB

## 2017-01-31 LAB — CREATININE, SERUM: Creatinine, Ser: 0.62 mg/dL (ref 0.61–1.24)

## 2017-01-31 LAB — CBC
HEMATOCRIT: 40.5 % (ref 39.0–52.0)
Hemoglobin: 13.7 g/dL (ref 13.0–17.0)
MCH: 30.3 pg (ref 26.0–34.0)
MCHC: 33.8 g/dL (ref 30.0–36.0)
MCV: 89.6 fL (ref 78.0–100.0)
PLATELETS: 192 10*3/uL (ref 150–400)
RBC: 4.52 MIL/uL (ref 4.22–5.81)
RDW: 13 % (ref 11.5–15.5)
WBC: 7.4 10*3/uL (ref 4.0–10.5)

## 2017-01-31 MED ORDER — SODIUM CHLORIDE 0.9 % IV BOLUS (SEPSIS)
1000.0000 mL | Freq: Once | INTRAVENOUS | Status: AC
  Administered 2017-01-31: 1000 mL via INTRAVENOUS

## 2017-01-31 MED ORDER — FENTANYL CITRATE (PF) 100 MCG/2ML IJ SOLN
50.0000 ug | Freq: Once | INTRAMUSCULAR | Status: AC
  Administered 2017-01-31: 50 ug via INTRAVENOUS
  Filled 2017-01-31: qty 2

## 2017-01-31 MED ORDER — ONDANSETRON 4 MG PO TBDP: 4.0000 mg | ORAL_TABLET | Freq: Four times a day (QID) | ORAL | Status: DC | PRN

## 2017-01-31 MED ORDER — ENOXAPARIN SODIUM 40 MG/0.4ML ~~LOC~~ SOLN
40.0000 mg | SUBCUTANEOUS | Status: DC
  Administered 2017-01-31 – 2017-02-02 (×3): 40 mg via SUBCUTANEOUS
  Filled 2017-01-31 (×3): qty 0.4

## 2017-01-31 MED ORDER — DIPHENHYDRAMINE HCL 25 MG PO CAPS: 25.0000 mg | ORAL_CAPSULE | Freq: Four times a day (QID) | ORAL | Status: DC | PRN

## 2017-01-31 MED ORDER — DIPHENHYDRAMINE HCL 50 MG/ML IJ SOLN
25.0000 mg | Freq: Four times a day (QID) | INTRAMUSCULAR | Status: DC | PRN
  Filled 2017-01-31: qty 1

## 2017-01-31 MED ORDER — OXYCODONE HCL 5 MG PO TABS: 5.0000 mg | ORAL_TABLET | ORAL | Status: DC | PRN

## 2017-01-31 MED ORDER — ONDANSETRON 4 MG PO TBDP
ORAL_TABLET | ORAL | Status: AC
  Filled 2017-01-31: qty 1

## 2017-01-31 MED ORDER — ONDANSETRON HCL 4 MG/2ML IJ SOLN
4.0000 mg | Freq: Four times a day (QID) | INTRAMUSCULAR | Status: DC | PRN
  Administered 2017-01-31 – 2017-02-01 (×3): 4 mg via INTRAVENOUS
  Filled 2017-01-31 (×3): qty 2

## 2017-01-31 MED ORDER — ONDANSETRON HCL 4 MG/2ML IJ SOLN
4.0000 mg | Freq: Once | INTRAMUSCULAR | Status: AC
  Administered 2017-01-31: 4 mg via INTRAVENOUS
  Filled 2017-01-31: qty 2

## 2017-01-31 MED ORDER — FENTANYL CITRATE (PF) 100 MCG/2ML IJ SOLN
12.5000 ug | INTRAMUSCULAR | Status: DC | PRN
  Administered 2017-01-31 – 2017-02-01 (×7): 25 ug via INTRAVENOUS
  Filled 2017-01-31 (×7): qty 2

## 2017-01-31 MED ORDER — VANCOMYCIN 50 MG/ML ORAL SOLUTION
125.0000 mg | Freq: Four times a day (QID) | ORAL | Status: DC
  Administered 2017-01-31 – 2017-02-03 (×13): 125 mg via ORAL
  Filled 2017-01-31 (×13): qty 2.5

## 2017-01-31 MED ORDER — HYDRALAZINE HCL 20 MG/ML IJ SOLN: 10.0000 mg | INTRAMUSCULAR | Status: DC | PRN

## 2017-01-31 MED ORDER — POTASSIUM CHLORIDE IN NACL 20-0.45 MEQ/L-% IV SOLN
INTRAVENOUS | Status: DC
  Administered 2017-01-31 – 2017-02-01 (×3): via INTRAVENOUS
  Filled 2017-01-31 (×4): qty 1000

## 2017-01-31 MED ORDER — IOPAMIDOL (ISOVUE-300) INJECTION 61%
INTRAVENOUS | Status: AC
  Administered 2017-01-31: 100 mL
  Filled 2017-01-31: qty 100

## 2017-01-31 MED ORDER — ACETAMINOPHEN 650 MG RE SUPP: 650.0000 mg | Freq: Four times a day (QID) | RECTAL | Status: DC | PRN

## 2017-01-31 MED ORDER — SIMETHICONE 80 MG PO CHEW
40.0000 mg | CHEWABLE_TABLET | Freq: Four times a day (QID) | ORAL | Status: DC | PRN
  Administered 2017-02-01: 40 mg via ORAL
  Filled 2017-01-31 (×2): qty 1

## 2017-01-31 MED ORDER — ACETAMINOPHEN 325 MG PO TABS
650.0000 mg | ORAL_TABLET | Freq: Four times a day (QID) | ORAL | Status: DC | PRN
  Filled 2017-01-31: qty 2

## 2017-01-31 MED ORDER — ONDANSETRON 4 MG PO TBDP
4.0000 mg | ORAL_TABLET | Freq: Once | ORAL | Status: AC | PRN
  Administered 2017-01-31: 4 mg via ORAL

## 2017-01-31 NOTE — Progress Notes (Signed)
CRITICAL VALUE ALERT  Critical Value:  C.Diff positive   Date & Time Notied:  1252 01/31/17   Provider Notified: PA Carlena BjornstadBrooke Meuth   Orders Received/Actions taken: Orders to come, will wipe down high touch areas with bleach wipes.

## 2017-01-31 NOTE — ED Notes (Signed)
ED Provider at bedside. 

## 2017-01-31 NOTE — ED Provider Notes (Signed)
[facility]-EMERGENCY DEPT Provider Note   CSN: [id number] Arrival date & time: 01/30/17  2209     History   Chief Complaint Chief Complaint  Patient presents with  . Abdominal Pain    HPI Dakota Goodman is a 26 y.o. male.  The history is provided by the patient.  Abdominal Pain   This is a new problem. The current episode started yesterday. The problem occurs constantly. The problem has been gradually worsening. The pain is associated with a previous surgery. The pain is located in the LUQ and RUQ. The pain is moderate. Associated symptoms include fever, diarrhea, nausea and vomiting. Pertinent negatives include hematochezia and dysuria. The symptoms are aggravated by certain positions and palpation. Relieved by: rest.  pt presents with upper abdominal pain with nausea/vomiting/diarrhea onset yesterday He reports recent appendectomy, felt improved and discharged He had return of pain at home with fever up to 101F at home He reports mild cough No dysuria   Past Medical History:  Diagnosis Date  . Chronic back pain    "all over"  . Family history of adverse reaction to anesthesia    "sister could feel qthing when she woke up; had to get adrenaline for low heart rate" (09/09/2016)    Patient Active Problem List   Diagnosis Date Noted  . Acute appendicitis 01/26/2017  . Perforated appendicitis 09/07/2016    Past Surgical History:  Procedure Laterality Date  . ABSCESS DRAINAGE  09/07/2016   Percutaneous drainage of periappendiceal abscess in IR    . IR RADIOLOGIST EVAL & MGMT  09/19/2016  . LAPAROSCOPIC APPENDECTOMY N/A 01/27/2017   Procedure: APPENDECTOMY LAPAROSCOPIC;  Surgeon: Axel Filleramirez, Armando, MD;  [facility];  Service: General;  Laterality: N/A;       Home Medications    Prior to Admission medications   Medication Sig Start Date End Date Taking? Authorizing Provider  Multiple Vitamin (MULTIVITAMIN WITH MINERALS) TABS tablet Take 1 tablet by mouth daily.   Yes  [provider]  Probiotic Product (PROBIOTIC PO) Take 1 capsule by mouth daily.   Yes [provider]  oxyCODONE (OXY IR/ROXICODONE) 5 MG immediate release tablet Take 1-2 tablets (5-10 mg total) by mouth every 6 (six) hours as needed for moderate pain. Patient not taking: Reported on 01/31/2017 01/28/17   Franne FortsMeuth, Brooke A, PA-C    Family History No family history on file.  Social History Social History  Substance Use Topics  . Smoking status: Never Smoker  . Smokeless tobacco: Never Used  . Alcohol use Yes     Comment: 09/09/2016 "1 beer last week; that was the first drink in years"     Allergies   Other and Sulfa drugs cross reactors   Review of Systems Review of Systems  Constitutional: Positive for fatigue and fever.  Respiratory: Positive for cough.   Gastrointestinal: Positive for abdominal pain, diarrhea, nausea and vomiting. Negative for hematochezia.  Genitourinary: Negative for dysuria.  All other systems reviewed and are negative.    Physical Exam Updated Vital Signs BP 131/74   Pulse 63   Temp 98.5 F (36.9 C) (Oral)   Resp 17   Ht 1.905 m (6\' 3" )   Wt 70.3 kg (155 lb)   SpO2 98%   BMI 19.37 kg/m   Physical Exam CONSTITUTIONAL: Well developed, no acute distress HEAD: Normocephalic/atraumatic EYES: EOMI/PERRL, no icterus ENMT: Mucous membranes dry NECK: supple no meningeal signs SPINE/BACK:entire spine nontender CV: S1/S2 noted, no murmurs/rubs/gallops noted LUNGS: Lungs are clear to  auscultation bilaterally, no apparent distress ABDOMEN: soft, incisions noted without erythema/drainage, diffuse mild tenderness.  +BS noted throughout abdomen GU:no cva tenderness NEURO: Pt is awake/alert/appropriate, moves all extremitiesx4.  No facial droop.   EXTREMITIES: pulses normal/equal, full ROM SKIN: pale PSYCH: no abnormalities of mood noted, alert and oriented to situation   ED Treatments / Results  Labs (all labs ordered are listed,  but only abnormal results are displayed) Labs Reviewed  COMPREHENSIVE METABOLIC PANEL - Abnormal; Notable for the following:       Result Value   Chloride 100 (*)    Glucose, Bld 114 (*)    AST 14 (*)    ALT 13 (*)    All other components within normal limits  URINALYSIS, ROUTINE W REFLEX MICROSCOPIC - Abnormal; Notable for the following:    Color, Urine AMBER (*)    APPearance HAZY (*)    Specific Gravity, Urine 1.032 (*)    Glucose, UA 50 (*)    Bilirubin Urine SMALL (*)    Ketones, ur 80 (*)    Protein, ur 100 (*)    Bacteria, UA RARE (*)    Squamous Epithelial / LPF 0-5 (*)    All other components within normal limits  LIPASE, BLOOD  CBC    EKG  EKG Interpretation None       Radiology Ct Abdomen Pelvis W Contrast  Result Date: 01/31/2017 CLINICAL DATA:  Appendectomy on Monday. Increased abdominal pain, nausea, vomiting, and diarrhea beginning today. EXAM: CT ABDOMEN AND PELVIS WITH CONTRAST TECHNIQUE: Multidetector CT imaging of the abdomen and pelvis was performed using the standard protocol following bolus administration of intravenous contrast. CONTRAST:  ISOVUE-300 IOPAMIDOL (ISOVUE-300) INJECTION 61% COMPARISON:  01/26/2017 FINDINGS: Examination is technically limited due to motion artifact, limiting full evaluation of much of the study. Lower chest: Atelectasis in the lung bases. Hepatobiliary: Liver parenchyma appears homogeneous without focal lesion identified. Gallbladder and bile ducts are not distended. Pancreas: Pancreatic parenchyma appears homogeneous. Spleen:  Appears homogeneous and normal in size. Adrenals/Urinary Tract: Adrenal glands, kidneys, and bladder are grossly unremarkable. No hydronephrosis. Stomach/Bowel: Interval appendiceal resection since previous study. Colon is decompressed. Mid and distal small bowel are diffusely dilated with thickened wall. Mesenteric edema in the pelvis. Small amount of free fluid in the pelvis. Changes likely represent  enteritis and ileus. Infectious or inflammatory causes could have this appearance although given the history of recent surgery, vascular injury could also be suspected. There is no evidence of pneumatosis in the abnormal bowel loops. No portal venous gas. Visualized mesenteric arteries and veins appear patent. Small fluid collection in the pelvis anterior to the rectum measuring about 3.7 cm in diameter could represent a loculated collection and abscess is not excluded. Vascular/Lymphatic: No significant vascular findings are present. No enlarged abdominal or pelvic lymph nodes. Reproductive: Prostate is unremarkable. Other: No free air in the abdomen. Abdominal wall musculature appears intact. Musculoskeletal: No acute or significant osseous findings. IMPRESSION: 1. Mid and distal small bowel are diffusely dilated with thickened wall. Mesenteric edema and free fluid in the pelvis. Changes likely to represent enteritis and ileus of nonspecific etiology. Consider infectious or inflammatory causes or vascular injury. No specific signs of vascular injury. Small fluid collection in the pelvis may represent an abscess. 2. No free air to suggest perforation. 3. Examination is technically limited due to motion artifact. Electronically Signed   By: Burman Nieves M.D.   On: 01/31/2017 04:59    Procedures Procedures   Medications  Ordered in ED Medications  ondansetron (ZOFRAN-ODT) disintegrating tablet 4 mg (4 mg Oral Given 01/31/17 0045)  fentaNYL (SUBLIMAZE) injection 50 mcg (50 mcg Intravenous Given 01/31/17 0355)  ondansetron (ZOFRAN) injection 4 mg (4 mg Intravenous Given 01/31/17 0353)  iopamidol (ISOVUE-300) 61 % injection (100 mLs  Contrast Given 01/31/17 0436)  sodium chloride 0.9 % bolus 1,000 mL (0 mLs Intravenous Stopped 01/31/17 0624)  ondansetron (ZOFRAN) injection 4 mg (4 mg Intravenous Given 01/31/17 [id number])     Initial Impression / Assessment and Plan / ED Course  I have reviewed the triage vital signs  and the nursing notes.  Pertinent labs  results that were available during my care of the patient were reviewed by me and considered in my medical decision making (see chart for details).     3:28 AM Pt with h/o periappendiceal abscess requiring perc drain, then had appendectomy on 7/30 here with abd pain/fever/vomiting/diarrhea Discussed case with dr Gaynelle Adueric wilson Will proceed with CT imaging  7:45 AM attempted PO challenge, pt still with pain and nausea, reports he feels worse than when he left hospital earlier in the week At the recommendation of dr Andrey Campanilewilson on previous conversation, will admit to surgery service Consult pending for admission  Final Clinical Impressions(s) / ED Diagnoses   Final diagnoses:  Intractable cyclical vomiting with nausea  Postoperative pain    New Prescriptions New Prescriptions   No medications on file     Zadie RhineWickline, Ahmadou Bolz, MD 01/31/17 [phone]

## 2017-01-31 NOTE — ED Notes (Signed)
Pt returned to room from CT

## 2017-01-31 NOTE — H&P (Signed)
[facility] Surgery Admission Note  Dakota Goodman [facility] 12/03/1990  [id number].    Requesting MD: Ripley Fraise Chief Complaint/Reason for Consult: Postop abdominal pain  HPI:  Dakota Goodman is a 26yo male 4 days s/p laparoscopic appendectomy on 7/30 by Dr. Rosendo Gros, who presented to the ED earlier this morning with progressive abdominal pain, nausea, vomiting, and diarrhea. States that his symptoms began yesterday morning and have progressively gotten worse. Pain is central/lower in his abdomen. It is constant and severe. Unable to tolerate food or liquid by mouth. States that he has had a temp of 100 at home. He is having watery diarrhea every 30 minutes. Denies blood in stool.   ROS: Review of Systems  Constitutional: Positive for chills and fever.  HENT: Negative.   Eyes: Negative.   Respiratory: Negative.   Cardiovascular: Negative.   Gastrointestinal: Positive for abdominal pain, diarrhea, nausea and vomiting. Negative for blood in stool, constipation, heartburn and melena.  Genitourinary: Negative.   Musculoskeletal: Negative.   Skin: Negative.   Neurological: Negative.   All systems reviewed and otherwise negative except for as above  No family history on file.  Past Medical History:  Diagnosis Date  . Chronic back pain    "all over"  . Family history of adverse reaction to anesthesia    "sister could feel qthing when she woke up; had to get adrenaline for low heart rate" (09/09/2016)    Past Surgical History:  Procedure Laterality Date  . ABSCESS DRAINAGE  09/07/2016   Percutaneous drainage of periappendiceal abscess in IR    . IR RADIOLOGIST EVAL & MGMT  09/19/2016  . LAPAROSCOPIC APPENDECTOMY N/A 01/27/2017   Procedure: APPENDECTOMY LAPAROSCOPIC;  Surgeon: Ralene Ok, MD;  Location: [place];  Service: General;  Laterality: N/A;    Social History:  reports that he has never smoked. He has never used smokeless tobacco. He reports that he drinks  alcohol. He reports that he does not use drugs.  Allergies:  Allergies  Allergen Reactions  . Other Other (See Comments)    No Dairy or Meat VEGAN  . Sulfa Drugs Cross Reactors Hives     (Not in a hospital admission)  Prior to Admission medications   Medication Sig Start Date End Date Taking? Authorizing Provider  Multiple Vitamin (MULTIVITAMIN WITH MINERALS) TABS tablet Take 1 tablet by mouth daily.   Yes [provider]  Probiotic Product (PROBIOTIC PO) Take 1 capsule by mouth daily.   Yes [provider]  oxyCODONE (OXY IR/ROXICODONE) 5 MG immediate release tablet Take 1-2 tablets (5-10 mg total) by mouth every 6 (six) hours as needed for moderate pain. Patient not taking: Reported on 01/31/2017 01/28/17   Margie Billet A, PA-C    Blood pressure 117/65, pulse 61, temperature 98.5 F (36.9 C), temperature source Oral, resp. rate 20, height 6' 3"  (1.905 m), weight 155 lb (70.3 kg), SpO2 96 %. Physical Exam: General: pleasant, WD/WN white male who is laying in bed in NAD HEENT: head is normocephalic, atraumatic.  Sclera are noninjected.  Pupils equal and round.  Ears and nose without any masses or lesions.  Mouth is pink and moist. Dentition fair Heart: regular, rate, and rhythm.  No obvious murmurs, gallops, or rubs noted.  Palpable pedal pulses bilaterally Lungs: CTAB, no wheezes, rhonchi, or rales noted.  Respiratory effort nonlabored Abd: well healing lap incisions cdi with steris in place, soft, ND, +BS in all 4 quadrants, no masses, hernias, or organomegaly. +TTP LLQ>RLQ with no  rebound or guarding MS: all 4 extremities are symmetrical with no cyanosis, clubbing, or edema. Skin: warm and dry with no masses, lesions, or rashes Psych: A&Ox3 with an appropriate affect. Neuro: cranial nerves grossly intact, extremity CSM intact bilaterally, normal speech  Results for orders placed or performed during the hospital encounter of 01/31/17 (from the past 48 hour(s))   Lipase, blood     Status: None   Collection Time: 01/30/17 10:19 PM  Result Value Ref Range   Lipase 18 11 - 51 U/L  Comprehensive metabolic panel     Status: Abnormal   Collection Time: 01/30/17 10:19 PM  Result Value Ref Range   Sodium 136 135 - 145 mmol/L   Potassium 3.6 3.5 - 5.1 mmol/L   Chloride 100 (L) 101 - 111 mmol/L   CO2 26 22 - 32 mmol/L   Glucose, Bld 114 (H) 65 - 99 mg/dL   BUN 9 6 - 20 mg/dL   Creatinine, Ser 0.69 0.61 - 1.24 mg/dL   Calcium 9.1 8.9 - 10.3 mg/dL   Total Protein 6.8 6.5 - 8.1 g/dL   Albumin 3.5 3.5 - 5.0 g/dL   AST 14 (L) 15 - 41 U/L   ALT 13 (L) 17 - 63 U/L   Alkaline Phosphatase 77 38 - 126 U/L   Total Bilirubin 0.9 0.3 - 1.2 mg/dL   GFR calc non Af Amer >60 >60 mL/min   GFR calc Af Amer >60 >60 mL/min    Comment: (NOTE) The eGFR has been calculated using the CKD EPI equation. This calculation has not been validated in all clinical situations. eGFR's persistently <60 mL/min signify possible Chronic Kidney Disease.    Anion gap 10 5 - 15  CBC     Status: None   Collection Time: 01/30/17 10:19 PM  Result Value Ref Range   WBC 9.1 4.0 - 10.5 K/uL   RBC 5.18 4.22 - 5.81 MIL/uL   Hemoglobin 16.3 13.0 - 17.0 g/dL   HCT 46.1 39.0 - 52.0 %   MCV 89.0 78.0 - 100.0 fL   MCH 31.5 26.0 - 34.0 pg   MCHC 35.4 30.0 - 36.0 g/dL   RDW 12.8 11.5 - 15.5 %   Platelets 206 150 - 400 K/uL  Urinalysis, Routine w reflex microscopic     Status: Abnormal   Collection Time: 01/30/17 10:21 PM  Result Value Ref Range   Color, Urine AMBER (A) YELLOW    Comment: BIOCHEMICALS MAY BE AFFECTED BY COLOR   APPearance HAZY (A) CLEAR   Specific Gravity, Urine 1.032 (H) 1.005 - 1.030   pH 6.0 5.0 - 8.0   Glucose, UA 50 (A) NEGATIVE mg/dL   Hgb urine dipstick NEGATIVE NEGATIVE   Bilirubin Urine SMALL (A) NEGATIVE   Ketones, ur 80 (A) NEGATIVE mg/dL   Protein, ur 100 (A) NEGATIVE mg/dL   Nitrite NEGATIVE NEGATIVE   Leukocytes, UA NEGATIVE NEGATIVE   RBC / HPF 0-5 0  - 5 RBC/hpf   WBC, UA 0-5 0 - 5 WBC/hpf   Bacteria, UA RARE (A) NONE SEEN   Squamous Epithelial / LPF 0-5 (A) NONE SEEN   Mucous PRESENT    Ct Abdomen Pelvis W Contrast  Result Date: 01/31/2017 CLINICAL DATA:  Appendectomy on Monday. Increased abdominal pain, nausea, vomiting, and diarrhea beginning today. EXAM: CT ABDOMEN AND PELVIS WITH CONTRAST TECHNIQUE: Multidetector CT imaging of the abdomen and pelvis was performed using the standard protocol following bolus administration of intravenous contrast. CONTRAST:  173m  ISOVUE-300 IOPAMIDOL (ISOVUE-300) INJECTION 61% COMPARISON:  01/26/2017 FINDINGS: Examination is technically limited due to motion artifact, limiting full evaluation of much of the study. Lower chest: Atelectasis in the lung bases. Hepatobiliary: Liver parenchyma appears homogeneous without focal lesion identified. Gallbladder and bile ducts are not distended. Pancreas: Pancreatic parenchyma appears homogeneous. Spleen:  Appears homogeneous and normal in size. Adrenals/Urinary Tract: Adrenal glands, kidneys, and bladder are grossly unremarkable. No hydronephrosis. Stomach/Bowel: Interval appendiceal resection since previous study. Colon is decompressed. Mid and distal small bowel are diffusely dilated with thickened wall. Mesenteric edema in the pelvis. Small amount of free fluid in the pelvis. Changes likely represent enteritis and ileus. Infectious or inflammatory causes could have this appearance although given the history of recent surgery, vascular injury could also be suspected. There is no evidence of pneumatosis in the abnormal bowel loops. No portal venous gas. Visualized mesenteric arteries and veins appear patent. Small fluid collection in the pelvis anterior to the rectum measuring about 3.7 cm in diameter could represent a loculated collection and abscess is not excluded. Vascular/Lymphatic: No significant vascular findings are present. No enlarged abdominal or pelvic lymph  nodes. Reproductive: Prostate is unremarkable. Other: No free air in the abdomen. Abdominal wall musculature appears intact. Musculoskeletal: No acute or significant osseous findings. IMPRESSION: 1. Mid and distal small bowel are diffusely dilated with thickened wall. Mesenteric edema and free fluid in the pelvis. Changes likely to represent enteritis and ileus of nonspecific etiology. Consider infectious or inflammatory causes or vascular injury. No specific signs of vascular injury. Small fluid collection in the pelvis may represent an abscess. 2. No free air to suggest perforation. 3. Examination is technically limited due to motion artifact. Electronically Signed   By: Lucienne Capers M.D.   On: 01/31/2017 04:59      Assessment/Plan Enteritis S/p lap appy 7/30 - POD 4 - 24 hours of progressive abdominal pain, nausea, vomiting, and diarrhea - CT scan shows mid and distal small bowel are diffusely dilated with thickened wall, likely enteritis - WBC 9.1, afebrile in ED  ID - none VTE - SCDs, lovenox FEN - IVF, NPO  Plan - Admit to med-surg for hydration, symptom management, and observation. He does not need antibiotics. Check c diff.  Wellington Hampshire, [facility] Surgery 01/31/2017, 8:30 AM Pager: [phone] Consults: [phone] Mon-Fri 7:00 am-4:30 pm Sat-Sun 7:00 am-11:30 am

## 2017-01-31 NOTE — ED Notes (Signed)
Attempted report 

## 2017-01-31 NOTE — ED Notes (Signed)
Patient transported to CT 

## 2017-01-31 NOTE — ED Notes (Signed)
Hospitalist at bedside 

## 2017-01-31 NOTE — Progress Notes (Signed)
Patient arrived to 6n01, alert and oriented, mild pain and nausea but said its getting better. IV intact and saline locked, will hang fluids when arrive from pharmacy.No family/visitors present at the moment. Patient placed on enteric precautions for r/o c.diff. Patient oriented to room and staff, will continue to monitor.

## 2017-02-01 ENCOUNTER — Encounter (HOSPITAL_COMMUNITY): Payer: Self-pay

## 2017-02-01 LAB — BASIC METABOLIC PANEL
ANION GAP: 9 (ref 5–15)
BUN: 8 mg/dL (ref 6–20)
CO2: 25 mmol/L (ref 22–32)
Calcium: 8.3 mg/dL — ABNORMAL LOW (ref 8.9–10.3)
Chloride: 102 mmol/L (ref 101–111)
Creatinine, Ser: 0.73 mg/dL (ref 0.61–1.24)
Glucose, Bld: 82 mg/dL (ref 65–99)
POTASSIUM: 4 mmol/L (ref 3.5–5.1)
SODIUM: 136 mmol/L (ref 135–145)

## 2017-02-01 LAB — CBC
HEMATOCRIT: 39.2 % (ref 39.0–52.0)
Hemoglobin: 13.5 g/dL (ref 13.0–17.0)
MCH: 30.5 pg (ref 26.0–34.0)
MCHC: 34.4 g/dL (ref 30.0–36.0)
MCV: 88.7 fL (ref 78.0–100.0)
Platelets: 191 10*3/uL (ref 150–400)
RBC: 4.42 MIL/uL (ref 4.22–5.81)
RDW: 12.5 % (ref 11.5–15.5)
WBC: 6.5 10*3/uL (ref 4.0–10.5)

## 2017-02-01 MED ORDER — KCL IN DEXTROSE-NACL 20-5-0.45 MEQ/L-%-% IV SOLN
INTRAVENOUS | Status: DC
  Administered 2017-02-01 – 2017-02-02 (×2): via INTRAVENOUS
  Filled 2017-02-01 (×2): qty 1000

## 2017-02-01 MED ORDER — PROMETHAZINE HCL 25 MG/ML IJ SOLN
12.5000 mg | Freq: Four times a day (QID) | INTRAMUSCULAR | Status: DC | PRN
  Administered 2017-02-01 (×2): 12.5 mg via INTRAVENOUS
  Filled 2017-02-01 (×2): qty 1

## 2017-02-01 NOTE — Progress Notes (Signed)
Subjective/Chief Complaint: Still cramping, stills more formed, nausea but would like to try some clears   Objective: Vital signs in last 24 hours: Temp:  [98 F (36.7 C)-99.3 F (37.4 C)] 98.6 F (37 C) (08/04 16100638) Pulse Rate:  [57-78] 57 (08/04 0638) Resp:  [18] 18 (08/04 0638) BP: (116-140)/(64-80) 119/72 (08/04 96040638) SpO2:  [97 %-100 %] 97 % (08/04 0638) Last BM Date: 01/31/17  Intake/Output from previous day: 08/03 0701 - 08/04 0700 In: 2065 [I.V.:1065; IV Piggyback:1000] Out: 200 [Urine:200] Intake/Output this shift: No intake/output data recorded.  General appearance: no distress Resp: clear to auscultation bilaterally Cardio: regular rate and rhythm GI: incisions clean mildly tender throughout  Lab Results:   Recent Labs  01/31/17 1003 02/01/17 0708  WBC 7.4 6.5  HGB 13.7 13.5  HCT 40.5 39.2  PLT 192 191   BMET  Recent Labs  01/30/17 2219 01/31/17 1003 02/01/17 0708  NA 136  --  136  K 3.6  --  4.0  CL 100*  --  102  CO2 26  --  25  GLUCOSE 114*  --  82  BUN 9  --  8  CREATININE 0.69 0.62 0.73  CALCIUM 9.1  --  8.3*   PT/INR No results for input(s): LABPROT, INR in the last 72 hours. ABG No results for input(s): PHART, HCO3 in the last 72 hours.  Invalid input(s): PCO2, PO2  Studies/Results: Ct Abdomen Pelvis W Contrast  Result Date: 01/31/2017 CLINICAL DATA:  Appendectomy on Monday. Increased abdominal pain, nausea, vomiting, and diarrhea beginning today. EXAM: CT ABDOMEN AND PELVIS WITH CONTRAST TECHNIQUE: Multidetector CT imaging of the abdomen and pelvis was performed using the standard protocol following bolus administration of intravenous contrast. CONTRAST:  100mL ISOVUE-300 IOPAMIDOL (ISOVUE-300) INJECTION 61% COMPARISON:  01/26/2017 FINDINGS: Examination is technically limited due to motion artifact, limiting full evaluation of much of the study. Lower chest: Atelectasis in the lung bases. Hepatobiliary: Liver parenchyma appears  homogeneous without focal lesion identified. Gallbladder and bile ducts are not distended. Pancreas: Pancreatic parenchyma appears homogeneous. Spleen:  Appears homogeneous and normal in size. Adrenals/Urinary Tract: Adrenal glands, kidneys, and bladder are grossly unremarkable. No hydronephrosis. Stomach/Bowel: Interval appendiceal resection since previous study. Colon is decompressed. Mid and distal small bowel are diffusely dilated with thickened wall. Mesenteric edema in the pelvis. Small amount of free fluid in the pelvis. Changes likely represent enteritis and ileus. Infectious or inflammatory causes could have this appearance although given the history of recent surgery, vascular injury could also be suspected. There is no evidence of pneumatosis in the abnormal bowel loops. No portal venous gas. Visualized mesenteric arteries and veins appear patent. Small fluid collection in the pelvis anterior to the rectum measuring about 3.7 cm in diameter could represent a loculated collection and abscess is not excluded. Vascular/Lymphatic: No significant vascular findings are present. No enlarged abdominal or pelvic lymph nodes. Reproductive: Prostate is unremarkable. Other: No free air in the abdomen. Abdominal wall musculature appears intact. Musculoskeletal: No acute or significant osseous findings. IMPRESSION: 1. Mid and distal small bowel are diffusely dilated with thickened wall. Mesenteric edema and free fluid in the pelvis. Changes likely to represent enteritis and ileus of nonspecific etiology. Consider infectious or inflammatory causes or vascular injury. No specific signs of vascular injury. Small fluid collection in the pelvis may represent an abscess. 2. No free air to suggest perforation. 3. Examination is technically limited due to motion artifact. Electronically Signed   By: Marisa CyphersWilliam  Stevens M.D.  On: 01/31/2017 04:59    Anti-infectives: Anti-infectives    Start     Dose/Rate Route Frequency  Ordered Stop   01/31/17 1400  vancomycin (VANCOCIN) 50 mg/mL oral solution 125 mg     125 mg Oral Every 6 hours 01/31/17 1314 02/10/17 1159      Assessment/Plan: S/p lap appy for recurrent appendicitis C diff  Neuro- cont pain control as needed Pulm toilet GI- will give clears today ID- wbc normal, just started po vanc for c diff, will continue Likely here until Monday at earliest lovenox scds  [facility],Dakota Borgen 02/01/2017

## 2017-02-02 NOTE — Progress Notes (Signed)
   Subjective/Chief Complaint: Diarrhea less    Objective: Vital signs in last 24 hours: Temp:  [98.3 F (36.8 C)-99.7 F (37.6 C)] 98.3 F (36.8 C) (08/05 0617) Pulse Rate:  [56-64] 62 (08/05 0617) Resp:  [16-17] 17 (08/05 0617) BP: (113-117)/(62-64) 117/63 (08/05 0617) SpO2:  [98 %-100 %] 99 % (08/05 0617) Last BM Date: 02/02/17  Intake/Output from previous day: 08/04 0701 - 08/05 0700 In: 1918.3 [P.O.:1260; I.V.:658.3] Out: -  Intake/Output this shift: No intake/output data recorded.  Incision/Wound:soft NT incisions clean  Lab Results:   Recent Labs  01/31/17 1003 02/01/17 0708  WBC 7.4 6.5  HGB 13.7 13.5  HCT 40.5 39.2  PLT 192 191   BMET  Recent Labs  01/30/17 2219 01/31/17 1003 02/01/17 0708  NA 136  --  136  K 3.6  --  4.0  CL 100*  --  102  CO2 26  --  25  GLUCOSE 114*  --  82  BUN 9  --  8  CREATININE 0.69 0.62 0.73  CALCIUM 9.1  --  8.3*   PT/INR No results for input(s): LABPROT, INR in the last 72 hours. ABG No results for input(s): PHART, HCO3 in the last 72 hours.  Invalid input(s): PCO2, PO2  Studies/Results: No results found.  Anti-infectives: Anti-infectives    Start     Dose/Rate Route Frequency Ordered Stop   01/31/17 1400  vancomycin (VANCOCIN) 50 mg/mL oral solution 125 mg     125 mg Oral Every 6 hours 01/31/17 1314 02/10/17 1159      Assessment/Plan: Patient Active Problem List   Diagnosis Date Noted  . Enteritis 01/31/2017  . Acute appendicitis 01/26/2017  . Perforated appendicitis 09/07/2016    Continue PO vancomycin Home Monday  Advance diet    LOS: 1 day    Cris Gibby A. 02/02/2017

## 2017-02-02 NOTE — Progress Notes (Signed)
Pt taking regular diet now with good toleration, IV is NSL.  Pt up independent, ambulating on his own.  No c/o pain.

## 2017-02-03 MED ORDER — VANCOMYCIN 50 MG/ML ORAL SOLUTION: 125.0000 mg | Freq: Four times a day (QID) | ORAL | 0 refills | Status: AC

## 2017-02-03 MED ORDER — ONDANSETRON 4 MG PO TBDP: 4.0000 mg | ORAL_TABLET | Freq: Four times a day (QID) | ORAL | 0 refills | Status: DC | PRN

## 2017-02-03 MED FILL — FIRVANQ 50 MG/ML SOLN: 50 | 7 days supply | Qty: 150 | Fill #0

## 2017-02-03 NOTE — Discharge Summary (Signed)
[facility] Discharge Summary   Patient ID: Dakota Goodman MRN: [id number] DOB/AGE: 27-13-1992 27 y.o.  Admit date: 01/31/2017 Discharge date: 02/03/2017  Admitting Diagnosis: Abdominal pain Nausea Vomiting diarrhea  Discharge Diagnosis Patient Active Problem List   Diagnosis Date Noted  . Enteritis 01/31/2017  . Acute appendicitis 01/26/2017  . Perforated appendicitis 09/07/2016    Consultants none  Imaging: No results found.  Procedures None this admission  Hospital Course: Pt is a 26 year old male who [facility] with complaints of abdominal pain, nausea, vomiting and diarrhea. Pt had perforated appendicitis and was hospitalized on 09/07/2016 and was treated with IV abx and a perc drain. The drain was removed in April and interval appendectomy was discussed, however the decision was made to not proceed with surgery. Pt presented to the ED with appendicitis on 07/29 and a laparoscopic appendectomy was preformed that admission. Pt was admitted on 08/03 and was found to have C. Diff. Pt was treated with oral vancomycin and symptoms improved. Diet was advanced as tolerated.  On 08/06, the patient was voiding well, tolerating diet, VSS, and felt stable for discharge home.  Patient will follow up in our office in 1 week and knows to call with questions or concerns.  He will call to confirm appointment date/time.    Patient was discharged in good condition.  The [place] Substance controlled database was reviewed prior to prescribing narcotic pain medication to this patient.  Physical Exam: General:  Alert, NAD, pleasant, cooperative Cardio: RRR, S1 & S2 normal, no murmur, rubs, gallops Resp: Effort normal, lungs CTA bilaterally, no wheezes, rales, rhonchi Abd:  Soft, ND, normal bowel sounds, no tenderness, incisions clean and appear to be healing well Skin: warm and dry  Allergies as of 02/03/2017      Reactions   Other Other (See  Comments)   No Dairy or Meat VEGAN   Sulfa Drugs Cross Reactors Hives      Medication List    TAKE these medications   multivitamin with minerals Tabs tablet Take 1 tablet by mouth daily.   ondansetron 4 MG disintegrating tablet Commonly known as:  ZOFRAN-ODT Take 1 tablet (4 mg total) by mouth every 6 (six) hours as needed for nausea.   oxyCODONE 5 MG immediate release tablet Commonly known as:  Oxy IR/ROXICODONE Take 1-2 tablets (5-10 mg total) by mouth every 6 (six) hours as needed for moderate pain.   PROBIOTIC PO Take 1 capsule by mouth daily.   vancomycin 50 mg/mL oral solution Commonly known as:  VANCOCIN Take 2.5 mLs (125 mg total) by mouth every 6 (six) hours.        Follow-up Information    Dakota Goodman, Matthew, MD Follow up.   Specialty:  General Surgery Why:  keep your upcomming appointment with Dr. Dwain Goodman for follow up from surgery Contact information: [address] [place] [id number] [phone]           Signed: Joyce CopaJessica L [facility] 02/03/2017, 10:08 AM Pager: [phone] Consults: [phone] Mon-Fri 7:00 am-4:30 pm Sat-Sun 7:00 am-11:30 am

## 2017-02-03 NOTE — Discharge Instructions (Signed)
Clostridium Difficile Infection Clostridium difficile (C. difficile or C. diff) infection is a condition that causes inflammation of the large intestine (colon). This condition can result in damage to the lining of your colon and may lead to colitis. This infection can be passed from person to person (is contagious). What are the causes? C. diff is a bacterium that is normally found in the colon. This infection is caused when the balance of C. diff is changed and there is an overgrowth of C. diff. This is often caused by antibiotic use. What increases the risk? This condition is more likely to develop in people who:  Take antibiotic medicines.  Take a certain type of medicine called proton pump inhibitors over a long period of time (chronic use).  Are older.  Have had a C. diff infection before.  Have serious underlying conditions, such as colon cancer.  Are in the hospital.  Have a weak defense (immune) system.  Live in a place where there is a lot of contact with others, such as a nursing home.  Have had gastrointestinal (GI) tract surgery.  What are the signs or symptoms? Symptoms of this condition include:  Diarrhea. This may be bloody, watery, or yellow or green in color.  Fever.  Fatigue.  Loss of appetite.  Nausea.  Swelling, pain, or tenderness in the abdomen.  Dehydration. Dehydration can cause you to be tired and thirsty, have a dry mouth, and urinate less frequently.  How is this diagnosed? This condition is diagnosed with a medical history and physical exam. You may also have tests, including:  A test that checks for C. diff in your stool.  Blood tests.  A sigmoidoscopy or colonoscopy to look at your colon. These procedures involve passing an instrument through your rectum to look at the inside of your colon.  How is this treated? Treatment for this condition includes:  Antibiotics that keep C. diff from growing.  Stopping the antibiotics you were  on before the C. diff infection began. Only do this as told by your health care provider.  Fluids through an IV tube, if you are dehydrated.  Surgery to remove the infected part of the colon. This is rare.  Follow these instructions at home: Eating and drinking  Drink enough fluid to keep your urine clear or pale yellow. Avoid milk, caffeine, and alcohol.  Follow specific rehydration instructions as told by your health care provider.  Eat small, frequent meals instead of large meals. Medicines  Take your antibiotic medicine as told by your health care provider. Do not stop taking the antibiotic even if you start to feel better unless your health care provider told you to do that.  Take over-the-counter and prescription medicines only as told by your health care provider.  Do not use medicines to help with diarrhea. General instructions  Wash your hands thoroughly before you prepare food and after you use the bathroom. Make sure people who live with you also wash their hands often.  Clean surfaces that you touch with a product that contains chlorine bleach.  Keep all follow-up visits as told by your health care provider. This is important. Contact a health care provider if:  Your symptoms do not get better with treatment.  Your symptoms get worse with treatment.  Your symptoms go away and then return.  You have a fever.  You have new symptoms. Get help right away if:  You have increasing pain or tenderness in your abdomen.  You have stool  that is mostly bloody, or your stool looks dark black and tarry.  You cannot eat or drink without vomiting.  You have signs of dehydration, such as: ? Dark urine, very little urine, or no urine. ? Cracked lips. ? Not making tears when you cry. ? Dry mouth. ? Sunken eyes. ? Sleepiness. ? Weakness. ? Dizziness. This information is not intended to replace advice given to you by your health care provider. Make sure you discuss any  questions you have with your health care provider. Document Released: 03/27/2005 Document Revised: 11/23/2015 Document Reviewed: 12/19/2014 Elsevier Interactive Patient Education  2017 [employer]  Once you have completed the oral vancomycin and your diarrhea has resolved, your infection is no longer contagious. We still recommend proper hand hygiene.   Wash / shower every day. You may shower daily and replace your bandges after showering if needed. No bathing or submerging your wounds in water until they heal. FOLLOW UP in our office  1. Please call [facility] at [phone] to confirm appointment date and time  WHEN TO CALL US [phone]:  1. Poor pain control 2. Reactions / problems with new medications (rash/itching, nausea, etc)  3. Fever over 101.5 F (38.5 C) 4. Worsening swelling or bruising 5. Continued bleeding from wounds. 6. Increased pain, redness, or drainage from the wounds which could be signs of infection  The clinic staff is available to answer your questions during regular business hours (8:30am-5pm). Please dont hesitate to call and ask to speak to one of our nurses for clinical concerns.  If you have a medical emergency, go to the nearest emergency room or call 911.  [facility] from [facility] is always on call at the [facility]   [facility], [place]  [address], [address], [place], [place] [address] ?  MAIN: [phone] ? TOLL FREE: [phone] ?  FAX [phone]  [email or web address]

## 2017-02-03 NOTE — Care Management Note (Signed)
Case Management Note  Patient Details  Name: Dakota Goodman MRN: [id number] Date of Birth: 1991/06/04  Subjective/Objective:                    Action/Plan:   Expected Discharge Date:  02/03/17               Expected Discharge Plan:  Home/Self Care  In-House Referral:     Discharge planning Services  CM Consult, MATCH Program, Medication Assistance, Indigent Health Clinic  Post Acute Care Choice:    Choice offered to:  Patient  DME Arranged:    DME Agency:     HH Arranged:    HH Agency:     Status of Service:  Completed, signed off  If discussed at [employer] Length of Stay Meetings, dates discussed:    Additional Comments:  [name], [name], RN 02/03/2017, 10:30 AM

## 2017-02-12 ENCOUNTER — Emergency Department (HOSPITAL_COMMUNITY): Payer: Self-pay

## 2017-02-12 ENCOUNTER — Observation Stay (HOSPITAL_COMMUNITY): Payer: Self-pay

## 2017-02-12 ENCOUNTER — Observation Stay (HOSPITAL_COMMUNITY)
Admission: EM | Admit: 2017-02-12 | Discharge: 2017-02-13 | Disposition: A | Discharge status: Home / Self Care | Payer: Self-pay | Attending: Surgery | Admitting: Surgery

## 2017-02-12 ENCOUNTER — Encounter (HOSPITAL_COMMUNITY): Payer: Self-pay

## 2017-02-12 DIAGNOSIS — R112 Nausea with vomiting, unspecified: Secondary | ICD-10-CM

## 2017-02-12 DIAGNOSIS — R109 Unspecified abdominal pain: Secondary | ICD-10-CM | POA: Diagnosis present

## 2017-02-12 DIAGNOSIS — Z8619 Personal history of other infectious and parasitic diseases: Secondary | ICD-10-CM | POA: Insufficient documentation

## 2017-02-12 DIAGNOSIS — Z79899 Other long term (current) drug therapy: Secondary | ICD-10-CM | POA: Insufficient documentation

## 2017-02-12 DIAGNOSIS — A0472 Enterocolitis due to Clostridium difficile, not specified as recurrent: Principal | ICD-10-CM | POA: Insufficient documentation

## 2017-02-12 DIAGNOSIS — G8929 Other chronic pain: Secondary | ICD-10-CM | POA: Insufficient documentation

## 2017-02-12 DIAGNOSIS — Z882 Allergy status to sulfonamides status: Secondary | ICD-10-CM | POA: Insufficient documentation

## 2017-02-12 DIAGNOSIS — M545 Low back pain: Secondary | ICD-10-CM | POA: Insufficient documentation

## 2017-02-12 DIAGNOSIS — R1013 Epigastric pain: Secondary | ICD-10-CM

## 2017-02-12 LAB — URINALYSIS, ROUTINE W REFLEX MICROSCOPIC
BACTERIA UA: NONE SEEN
Bilirubin Urine: NEGATIVE
Glucose, UA: NEGATIVE mg/dL
HGB URINE DIPSTICK: NEGATIVE
KETONES UR: 80 mg/dL — AB
Leukocytes, UA: NEGATIVE
Nitrite: NEGATIVE
PROTEIN: 100 mg/dL — AB
Specific Gravity, Urine: 1.024 (ref 1.005–1.030)
Squamous Epithelial / LPF: NONE SEEN
WBC UA: NONE SEEN WBC/hpf (ref 0–5)
pH: 6 (ref 5.0–8.0)

## 2017-02-12 LAB — COMPREHENSIVE METABOLIC PANEL
ALK PHOS: 74 U/L (ref 38–126)
ALT: 14 U/L — AB (ref 17–63)
AST: 18 U/L (ref 15–41)
Albumin: 4.6 g/dL (ref 3.5–5.0)
Anion gap: 15 (ref 5–15)
BILIRUBIN TOTAL: 1.3 mg/dL — AB (ref 0.3–1.2)
BUN: 12 mg/dL (ref 6–20)
CALCIUM: 9.4 mg/dL (ref 8.9–10.3)
CO2: 20 mmol/L — AB (ref 22–32)
CREATININE: 0.77 mg/dL (ref 0.61–1.24)
Chloride: 103 mmol/L (ref 101–111)
Glucose, Bld: 149 mg/dL — ABNORMAL HIGH (ref 65–99)
Potassium: 3.1 mmol/L — ABNORMAL LOW (ref 3.5–5.1)
Sodium: 138 mmol/L (ref 135–145)
TOTAL PROTEIN: 7.4 g/dL (ref 6.5–8.1)

## 2017-02-12 LAB — CBC
HCT: 46.8 % (ref 39.0–52.0)
Hemoglobin: 16.4 g/dL (ref 13.0–17.0)
MCH: 30.8 pg (ref 26.0–34.0)
MCHC: 35 g/dL (ref 30.0–36.0)
MCV: 87.8 fL (ref 78.0–100.0)
PLATELETS: 329 10*3/uL (ref 150–400)
RBC: 5.33 MIL/uL (ref 4.22–5.81)
RDW: 12.9 % (ref 11.5–15.5)
WBC: 19.2 10*3/uL — ABNORMAL HIGH (ref 4.0–10.5)

## 2017-02-12 LAB — LIPASE, BLOOD: Lipase: 24 U/L (ref 11–51)

## 2017-02-12 MED ORDER — ENOXAPARIN SODIUM 40 MG/0.4ML ~~LOC~~ SOLN: 40.0000 mg | SUBCUTANEOUS | Status: DC

## 2017-02-12 MED ORDER — VANCOMYCIN 50 MG/ML ORAL SOLUTION
125.0000 mg | Freq: Four times a day (QID) | ORAL | Status: DC
  Administered 2017-02-13 (×3): 125 mg via ORAL
  Filled 2017-02-12 (×5): qty 2.5

## 2017-02-12 MED ORDER — SODIUM CHLORIDE 0.9 % IV BOLUS (SEPSIS)
1000.0000 mL | Freq: Once | INTRAVENOUS | Status: AC
  Administered 2017-02-12: 1000 mL via INTRAVENOUS

## 2017-02-12 MED ORDER — ONDANSETRON 4 MG PO TBDP
4.0000 mg | ORAL_TABLET | Freq: Four times a day (QID) | ORAL | Status: DC | PRN
  Filled 2017-02-12: qty 1

## 2017-02-12 MED ORDER — METHOCARBAMOL 500 MG PO TABS: 500.0000 mg | ORAL_TABLET | Freq: Four times a day (QID) | ORAL | Status: DC | PRN

## 2017-02-12 MED ORDER — GI COCKTAIL ~~LOC~~
30.0000 mL | Freq: Once | ORAL | Status: AC
  Administered 2017-02-12: 30 mL via ORAL
  Filled 2017-02-12: qty 30

## 2017-02-12 MED ORDER — ONDANSETRON HCL 4 MG/2ML IJ SOLN: 4.0000 mg | Freq: Four times a day (QID) | INTRAMUSCULAR | Status: DC | PRN

## 2017-02-12 MED ORDER — OXYCODONE HCL 5 MG PO TABS: 5.0000 mg | ORAL_TABLET | ORAL | Status: DC | PRN

## 2017-02-12 MED ORDER — SIMETHICONE 80 MG PO CHEW: 40.0000 mg | CHEWABLE_TABLET | Freq: Four times a day (QID) | ORAL | Status: DC | PRN

## 2017-02-12 MED ORDER — HYDROMORPHONE HCL 1 MG/ML IJ SOLN: 1.0000 mg | INTRAMUSCULAR | Status: DC | PRN

## 2017-02-12 MED ORDER — IOPAMIDOL (ISOVUE-300) INJECTION 61%
INTRAVENOUS | Status: AC
  Administered 2017-02-12: 100 mL
  Filled 2017-02-12: qty 100

## 2017-02-12 MED ORDER — FENTANYL CITRATE (PF) 100 MCG/2ML IJ SOLN
25.0000 ug | Freq: Once | INTRAMUSCULAR | Status: AC
  Administered 2017-02-12: 25 ug via INTRAVENOUS
  Filled 2017-02-12: qty 2

## 2017-02-12 MED ORDER — ONDANSETRON HCL 4 MG/2ML IJ SOLN
4.0000 mg | Freq: Once | INTRAMUSCULAR | Status: AC
  Administered 2017-02-12: 4 mg via INTRAVENOUS
  Filled 2017-02-12: qty 2

## 2017-02-12 MED ORDER — KCL IN DEXTROSE-NACL 20-5-0.9 MEQ/L-%-% IV SOLN
INTRAVENOUS | Status: DC
Start: 2017-02-12 — End: 2017-02-13
  Administered 2017-02-13: via INTRAVENOUS
  Filled 2017-02-12 (×2): qty 1000

## 2017-02-12 NOTE — ED Notes (Signed)
Patient transported to Ultrasound 

## 2017-02-12 NOTE — ED Notes (Signed)
Attempted report x1. 

## 2017-02-12 NOTE — ED Notes (Signed)
Patient transported to CT 

## 2017-02-12 NOTE — H&P (Signed)
Dakota Goodman is an 26 y.o. male.   Chief Complaint: Abdominal pain HPI:  The patient presents with 24 hours of diffuse abdominal pain or low back pain. He underwent laparoscopic appendectomy on 01/27/2017. His postoperative course was complicated by C. difficile colitis. He was admitted for treatment of this and discharged. He has been home for 10 days. He states yesterday evening after dinner he developed diffuse abdominal pain which was more localized in his epigastrium and right upper quadrant. He denies diarrhea. He is having normal bowel movements. He was found to have a white count 19,000. He's been having nausea and vomiting since last night. He cannot keep anything down. Repeat CT scan was performed which showed resolving enteritis without complication feature. No evidence of intra-abdominal abscess. Significant stool burden noted. He feels improved at this point time but still has diffuse abdominal pain but this is better than before. Past Medical History:  Diagnosis Date  . Chronic back pain    "all over"  . Family history of adverse reaction to anesthesia    "sister could feel qthing when she woke up; had to get adrenaline for low heart rate" (09/09/2016)    Past Surgical History:  Procedure Laterality Date  . ABSCESS DRAINAGE  09/07/2016   Percutaneous drainage of periappendiceal abscess in IR    . APPENDECTOMY    . IR RADIOLOGIST EVAL & MGMT  09/19/2016  . LAPAROSCOPIC APPENDECTOMY N/A 01/27/2017   Procedure: APPENDECTOMY LAPAROSCOPIC;  Surgeon: Ralene Ok, MD;  Location: [facility];  Service: General;  Laterality: N/A;    History reviewed. No pertinent family history. Social History:  reports that he has never smoked. He has never used smokeless tobacco. He reports that he drinks alcohol. He reports that he does not use drugs.  Allergies:  Allergies  Allergen Reactions  . Other Other (See Comments)    No Dairy or Meat VEGAN  . Sulfa Drugs Cross Reactors Hives      (Not in a hospital admission)  Results for orders placed or performed during the hospital encounter of 02/12/17 (from the past 48 hour(s))  Lipase, blood     Status: None   Collection Time: 02/12/17  1:33 PM  Result Value Ref Range   Lipase 24 11 - 51 U/L  Comprehensive metabolic panel     Status: Abnormal   Collection Time: 02/12/17  1:33 PM  Result Value Ref Range   Sodium 138 135 - 145 mmol/L   Potassium 3.1 (L) 3.5 - 5.1 mmol/L   Chloride 103 101 - 111 mmol/L   CO2 20 (L) 22 - 32 mmol/L   Glucose, Bld 149 (H) 65 - 99 mg/dL   BUN 12 6 - 20 mg/dL   Creatinine, Ser 0.77 0.61 - 1.24 mg/dL   Calcium 9.4 8.9 - 10.3 mg/dL   Total Protein 7.4 6.5 - 8.1 g/dL   Albumin 4.6 3.5 - 5.0 g/dL   AST 18 15 - 41 U/L   ALT 14 (L) 17 - 63 U/L   Alkaline Phosphatase 74 38 - 126 U/L   Total Bilirubin 1.3 (H) 0.3 - 1.2 mg/dL   GFR calc non Af Amer >60 >60 mL/min   GFR calc Af Amer >60 >60 mL/min    Comment: (NOTE) The eGFR has been calculated using the CKD EPI equation. This calculation has not been validated in all clinical situations. eGFR's persistently <60 mL/min signify possible Chronic Kidney Disease.    Anion gap 15 5 - 15  CBC  Status: Abnormal   Collection Time: 02/12/17  1:33 PM  Result Value Ref Range   WBC 19.2 (H) 4.0 - 10.5 K/uL   RBC 5.33 4.22 - 5.81 MIL/uL   Hemoglobin 16.4 13.0 - 17.0 g/dL   HCT 46.8 39.0 - 52.0 %   MCV 87.8 78.0 - 100.0 fL   MCH 30.8 26.0 - 34.0 pg   MCHC 35.0 30.0 - 36.0 g/dL   RDW 12.9 11.5 - 15.5 %   Platelets 329 150 - 400 K/uL  Urinalysis, Routine w reflex microscopic     Status: Abnormal   Collection Time: 02/12/17  7:53 PM  Result Value Ref Range   Color, Urine YELLOW YELLOW   APPearance CLEAR CLEAR   Specific Gravity, Urine 1.024 1.005 - 1.030   pH 6.0 5.0 - 8.0   Glucose, UA NEGATIVE NEGATIVE mg/dL   Hgb urine dipstick NEGATIVE NEGATIVE   Bilirubin Urine NEGATIVE NEGATIVE   Ketones, ur 80 (A) NEGATIVE mg/dL   Protein, ur 100  (A) NEGATIVE mg/dL   Nitrite NEGATIVE NEGATIVE   Leukocytes, UA NEGATIVE NEGATIVE   RBC / HPF 0-5 0 - 5 RBC/hpf   WBC, UA NONE SEEN 0 - 5 WBC/hpf   Bacteria, UA NONE SEEN NONE SEEN   Squamous Epithelial / LPF NONE SEEN NONE SEEN   Mucous PRESENT    Ct Abdomen Pelvis W Contrast  Result Date: 02/12/2017 CLINICAL DATA:  Low back pain abdominal pain. Appendectomy 01/27/2017. Elevated white blood cell count EXAM: CT ABDOMEN AND PELVIS WITH CONTRAST TECHNIQUE: Multidetector CT imaging of the abdomen and pelvis was performed using the standard protocol following bolus administration of intravenous contrast. CONTRAST:  173m ISOVUE-300 IOPAMIDOL (ISOVUE-300) INJECTION 61% COMPARISON:  CT 01/26/2017, 09/19/2016, 09/07/2016 FINDINGS: Lower chest: Lung bases are clear. Hepatobiliary: No focal hepatic lesion. No duct dilatation. Gallbladder normal by CT imaging. No distention. Common bile duct normal caliber. Pancreas: No pancreatic inflammation. Pancreatic duct is not visible and therefore not dilated. Spleen: Adrenals/urinary tract: Adrenal glands normal. Kidneys enhance uniformly ureters and bladder normal. Stomach/Bowel: Stomach, duodenum normal. There is considerable reduction in the small bowel dilatation is seen on comparison exam. Also reduction in the bowel wall enhancement seen on comparison exam. Mild bowel wall enhanced remains in distal small bowel. There remains some fecalization of enteric contents within distal small bowel (Image 40, series 6) No evidence of abscess within the mesenteries of the small bowel. No free fluid. Patient status post appendectomy. The ascending colon appears normal. Transverse colon appears normal and stool-filled. Descending colon and sigmoid colon are normal. Vascular/Lymphatic: Abdominal aorta is normal caliber. There is no retroperitoneal or periportal lymphadenopathy. No pelvic lymphadenopathy. Reproductive: Prostate normal Other: No free-fluid abscess or inflammation  abdomen or pelvis. Musculoskeletal: No aggressive osseous lesion. IMPRESSION: 1. Considerable reduction in small bowel inflammation and as well as small bowel dilatation compared to CT of 01/31/2017. Mild fecalization within the distal small bowel remains and mild bowel wall thickening / edema. 2. No evidence of abscess with the abdomen or pelvis. No abnormal fluid collections. 3. No evidence of biliary obstruction or hepatic abscess. 4. Kidneys enhance symmetrically . Electronically Signed   By: SSuzy BouchardM.D.   On: 02/12/2017 19:46    Review of Systems  Constitutional: Negative for fever.  HENT: Negative for hearing loss.   Eyes: Negative for blurred vision.  Cardiovascular: Negative for chest pain and palpitations.  Gastrointestinal: Positive for abdominal pain, nausea and vomiting. Negative for constipation and diarrhea.  Genitourinary:  Negative for dysuria and urgency.  Musculoskeletal: Negative for myalgias.  Skin: Negative for rash.  Neurological: Positive for dizziness.  Psychiatric/Behavioral: Positive for depression.    Blood pressure 119/68, pulse 75, temperature 99.3 F (37.4 C), temperature source Oral, resp. rate 20, height _0  (1.905 m), weight 70.3 kg (155 lb), SpO2 99 %. Physical Exam  Constitutional: He is oriented to person, place, and time. He appears well-developed and well-nourished.  HENT:  Head: Normocephalic.  Eyes: Pupils are equal, round, and reactive to light. EOM are normal.  Neck: Normal range of motion. Neck supple.  Cardiovascular: Normal rate and regular rhythm.   Respiratory: Effort normal and breath sounds normal.  GI: Soft. He exhibits no distension. There is tenderness. There is no rebound and no guarding.  Musculoskeletal: Normal range of motion.  Neurological: He is alert and oriented to person, place, and time.     Assessment/Plan Abdominal pain  Leukocytosis  Dehydration  Plan: Admit for ultrasound to reassess gallbladder.  Rehydration tonight. Recheck white count in the  morning. No evidence of abscess or other complicated feature. Hold antibiotics given history of C. difficile colitis and unknown source.  Jorma Tassinari A., MD 02/12/2017, 8:58 PM

## 2017-02-12 NOTE — ED Notes (Signed)
Pt. Stated, I tried Hydrocodone and Zofran earlier and it did not help.

## 2017-02-12 NOTE — ED Notes (Signed)
Pt in US at this time, will transport to the floor after pt has finished with scan.

## 2017-02-12 NOTE — ED Notes (Signed)
General surgery at the bedside.

## 2017-02-12 NOTE — ED Triage Notes (Addendum)
Pt endorses sudden sharp abd pain that began last night with n/v. Pt had appendectomy 2 weeks ago. VSS. Pt took oxy and zofran at 0800 without relief.

## 2017-02-12 NOTE — ED Notes (Signed)
ED Provider at bedside. 

## 2017-02-12 NOTE — ED Provider Notes (Signed)
[facility]-EMERGENCY DEPT Provider Note   CSN: [id number] Arrival date & time: 02/12/17  1227     History   Chief Complaint Chief Complaint  Patient presents with  . Abdominal Pain    HPI Dakota Goodman is a 26 y.o. male who presents to the emergency department with a chief complaint of abdominal pain with associated nausea and vomiting.   He reports that he recently under went a laparoscopic appendectomy on 01/27/2017 and was diagnosed with C. difficile on 01/31/2017. His last follow-up with the surgery clinic was yesterday morning, prior to the onset of his symptoms.   He reports sudden onset, non-radiating, pressure-like, sharp epigastric abdominal pain that began at approximately 6 PM last night with associated non-bloody, non-bilious emesis occurring approximately every hour since onset. No aggravating or alleviating factors. He treated his symptoms with pain medication and Zofran, but has been able to keep down anything by mouth. He denies fever, chills, diarrhea, hematemesis, dysuria, hematuria, or frequency.   No other pertinent past medical history or daily medications. No sick contacts. No recent travel.   The history is provided by the patient. No language interpreter was used.    Past Medical History:  Diagnosis Date  . Chronic back pain    "all over"  . Family history of adverse reaction to anesthesia    "sister could feel qthing when she woke up; had to get adrenaline for low heart rate" (09/09/2016)    Patient Active Problem List   Diagnosis Date Noted  . Enteritis 01/31/2017  . Acute appendicitis 01/26/2017  . Perforated appendicitis 09/07/2016    Past Surgical History:  Procedure Laterality Date  . ABSCESS DRAINAGE  09/07/2016   Percutaneous drainage of periappendiceal abscess in IR    . APPENDECTOMY    . IR RADIOLOGIST EVAL & MGMT  09/19/2016  . LAPAROSCOPIC APPENDECTOMY N/A 01/27/2017   Procedure: APPENDECTOMY LAPAROSCOPIC;  Surgeon: Axel Filler, MD;   Location: [facility] OR;  Service: General;  Laterality: N/A;       Home Medications    Prior to Admission medications   Medication Sig Start Date End Date Taking? Authorizing Provider  b complex vitamins tablet Take 1 tablet by mouth daily.   Yes [provider]  ferrous sulfate 325 (65 FE) MG tablet Take 325 mg by mouth daily with breakfast.   Yes [provider]  FIRVANQ 50 MG/ML SOLR Take 2.5 mLs by mouth daily. 02/03/17  Yes [provider]  magnesium 30 MG tablet Take 30 mg by mouth daily.   Yes [provider]  Multiple Vitamin (MULTIVITAMIN WITH MINERALS) TABS tablet Take 1 tablet by mouth daily.   Yes [provider]  ondansetron (ZOFRAN-ODT) 4 MG disintegrating tablet Take 1 tablet (4 mg total) by mouth every 6 (six) hours as needed for nausea. 02/03/17  Yes Focht, Jessica L, PA  oxyCODONE (OXY IR/ROXICODONE) 5 MG immediate release tablet Take 1-2 tablets (5-10 mg total) by mouth every 6 (six) hours as needed for moderate pain. 01/28/17  Yes Meuth, Brooke A, PA-C  Probiotic Product (PROBIOTIC PO) Take 1 capsule by mouth daily.   Yes [provider]    Family History History reviewed. No pertinent family history.  Social History Social History  Substance Use Topics  . Smoking status: Never Smoker  . Smokeless tobacco: Never Used  . Alcohol use Yes     Comment: 09/09/2016 "1 beer last week; that was the first drink in years"     Allergies  Other and Sulfa drugs cross reactors   Review of Systems Review of Systems  Constitutional: Negative for chills and fever.  Respiratory: Negative for shortness of breath.   Cardiovascular: Negative for chest pain.  Gastrointestinal: Positive for abdominal pain, nausea and vomiting. Negative for constipation and diarrhea.  Genitourinary: Negative for dysuria.  Musculoskeletal: Negative for arthralgias and myalgias.   Physical Exam Updated Vital Signs BP 119/68   Pulse 75   Temp 99.3  F (37.4 C) (Oral)   Resp 20   Ht 6\' 3"  (1.905 m)   Wt 70.3 kg (155 lb)   SpO2 99%   BMI 19.37 kg/m   Physical Exam  Constitutional: He appears well-developed.  HENT:  Head: Normocephalic.  Dry mucous membranes.   Eyes: Conjunctivae are normal.  Neck: Neck supple.  Cardiovascular: Normal rate, regular rhythm, normal heart sounds and intact distal pulses.  Exam reveals no gallop and no friction rub.   No murmur heard. Pulmonary/Chest: Effort normal and breath sounds normal. No respiratory distress. He has no wheezes. He has no rales. He exhibits no tenderness.  Abdominal: Soft. Bowel sounds are normal. He exhibits no distension and no mass. There is tenderness. There is no rebound. No hernia.  Moderate TTP over the epigastric region and mild diffuse TTP throughout the remainder of the abdomen.   Musculoskeletal: Normal range of motion. He exhibits tenderness. He exhibits no edema or deformity.  Neurological: He is alert.  Skin: Skin is warm and dry. Capillary refill takes 2 to 3 seconds.  Psychiatric: His behavior is normal.  Nursing note and vitals reviewed.  ED Treatments / Results  Labs (all labs ordered are listed, but only abnormal results are displayed) Labs Reviewed  COMPREHENSIVE METABOLIC PANEL - Abnormal; Notable for the following:       Result Value   Potassium 3.1 (*)    CO2 20 (*)    Glucose, Bld 149 (*)    ALT 14 (*)    Total Bilirubin 1.3 (*)    All other components within normal limits  CBC - Abnormal; Notable for the following:    WBC 19.2 (*)    All other components within normal limits  URINALYSIS, ROUTINE W REFLEX MICROSCOPIC - Abnormal; Notable for the following:    Ketones, ur 80 (*)    Protein, ur 100 (*)    All other components within normal limits  LIPASE, BLOOD    EKG  EKG Interpretation None       Radiology Ct Abdomen Pelvis W Contrast  Result Date: 02/12/2017 CLINICAL DATA:  Low back pain abdominal pain. Appendectomy 01/27/2017.  Elevated white blood cell count EXAM: CT ABDOMEN AND PELVIS WITH CONTRAST TECHNIQUE: Multidetector CT imaging of the abdomen and pelvis was performed using the standard protocol following bolus administration of intravenous contrast. CONTRAST:  ISOVUE-300 IOPAMIDOL (ISOVUE-300) INJECTION 61% COMPARISON:  CT 01/26/2017, 09/19/2016, 09/07/2016 FINDINGS: Lower chest: Lung bases are clear. Hepatobiliary: No focal hepatic lesion. No duct dilatation. Gallbladder normal by CT imaging. No distention. Common bile duct normal caliber. Pancreas: No pancreatic inflammation. Pancreatic duct is not visible and therefore not dilated. Spleen: Adrenals/urinary tract: Adrenal glands normal. Kidneys enhance uniformly ureters and bladder normal. Stomach/Bowel: Stomach, duodenum normal. There is considerable reduction in the small bowel dilatation is seen on comparison exam. Also reduction in the bowel wall enhancement seen on comparison exam. Mild bowel wall enhanced remains in distal small bowel. There remains some fecalization of enteric contents within distal small bowel (Image 40, series  6) No evidence of abscess within the mesenteries of the small bowel. No free fluid. Patient status post appendectomy. The ascending colon appears normal. Transverse colon appears normal and stool-filled. Descending colon and sigmoid colon are normal. Vascular/Lymphatic: Abdominal aorta is normal caliber. There is no retroperitoneal or periportal lymphadenopathy. No pelvic lymphadenopathy. Reproductive: Prostate normal Other: No free-fluid abscess or inflammation abdomen or pelvis. Musculoskeletal: No aggressive osseous lesion. IMPRESSION: 1. Considerable reduction in small bowel inflammation and as well as small bowel dilatation compared to CT of 01/31/2017. Mild fecalization within the distal small bowel remains and mild bowel wall thickening / edema. 2. No evidence of abscess with the abdomen or pelvis. No abnormal fluid collections. 3. No  evidence of biliary obstruction or hepatic abscess. 4. Kidneys enhance symmetrically . Electronically Signed   By: Genevive BiStewart  Edmunds M.D.   On: 02/12/2017 19:46    Procedures Procedures (including critical care time)  Medications Ordered in ED Medications  Vancomycin HCl SOLR 125 mg (not administered)  sodium chloride 0.9 % bolus 1,000 mL (0 mLs Intravenous Stopped 02/12/17 1955)  ondansetron (ZOFRAN) injection 4 mg (4 mg Intravenous Given 02/12/17 1841)  fentaNYL (SUBLIMAZE) injection 25 mcg (25 mcg Intravenous Given 02/12/17 1843)  iopamidol (ISOVUE-300) 61 % injection (100 mLs  Contrast Given 02/12/17 1911)  gi cocktail (Maalox,Lidocaine,Donnatal) (30 mLs Oral Given 02/12/17 2059)     Initial Impression / Assessment and Plan / ED Course  I have reviewed the triage vital signs and the nursing notes.  Pertinent labs & imaging results that were available during my care of the patient were reviewed by me and considered in my medical decision making (see chart for details).     26-old-male with a history of a laparoscopic appendectomy on 01/27/2017 who was admitted and treated for C. Difficile from 8/3-10/2016 with complete resolution of symptoms. He presents today with epigastric abdominal pain, N/V onset last night. The patient was seen and evaluated by Dr. Adriana Simasook, attending physician.  WBC 19. Afebrile. VSS. The patient has been unable to keep down food or fluids by mouth despite taking Zofran since onset of symptoms. CT abdomen pelvis demonstrating considerable reduction in small bowel inflammation" compared to previous CT on 01/31/2017. Mild fecal dilatation within the distal small bowel remains as well as mild bowel wall thickening and edema. No evidence of abscess, abnormal fluid collections, or biliary obstruction. The patient was by fluid challenged in the ED, but became nauseated ~2 hours after IV Zofran. Pain controlled in the ED with fentanyl. Consulted Dr. Luisa Hartornett with general surgery to  discuss the patient's symptoms, leukocytosis, and imaging results, who will admit the patient to the surgical service for continued evaluation and treatment. The patient appears reasonably stabilized for admission considering the current resources, flow, and capabilities available in the ED at this time, and I doubt any other [facility] requiring further screening and/or treatment in the ED prior to admission.  Final Clinical Impressions(s) / ED Diagnoses   Final diagnoses:  Epigastric pain  Nausea and vomiting, intractability of vomiting not specified, unspecified vomiting type    New Prescriptions New Prescriptions   No medications on file     Alvy BimlerMcDonald, Telesforo Brosnahan A, PA-C 02/12/17 2103    Donnetta Hutchingook, Brian, MD 02/15/17 0930

## 2017-02-13 ENCOUNTER — Encounter (HOSPITAL_COMMUNITY): Payer: Self-pay | Admitting: General Practice

## 2017-02-13 LAB — COMPREHENSIVE METABOLIC PANEL
ALBUMIN: 3.2 g/dL — AB (ref 3.5–5.0)
ALK PHOS: 59 U/L (ref 38–126)
ALT: 9 U/L — ABNORMAL LOW (ref 17–63)
ANION GAP: 7 (ref 5–15)
AST: 12 U/L — ABNORMAL LOW (ref 15–41)
BILIRUBIN TOTAL: 1 mg/dL (ref 0.3–1.2)
BUN: 13 mg/dL (ref 6–20)
CALCIUM: 8.4 mg/dL — AB (ref 8.9–10.3)
CO2: 27 mmol/L (ref 22–32)
Chloride: 105 mmol/L (ref 101–111)
Creatinine, Ser: 0.65 mg/dL (ref 0.61–1.24)
GLUCOSE: 102 mg/dL — AB (ref 65–99)
POTASSIUM: 3 mmol/L — AB (ref 3.5–5.1)
Sodium: 139 mmol/L (ref 135–145)
TOTAL PROTEIN: 5.5 g/dL — AB (ref 6.5–8.1)

## 2017-02-13 LAB — CBC
HEMATOCRIT: 39.9 % (ref 39.0–52.0)
HEMOGLOBIN: 13.6 g/dL (ref 13.0–17.0)
MCH: 30.8 pg (ref 26.0–34.0)
MCHC: 34.1 g/dL (ref 30.0–36.0)
MCV: 90.3 fL (ref 78.0–100.0)
Platelets: 261 10*3/uL (ref 150–400)
RBC: 4.42 MIL/uL (ref 4.22–5.81)
RDW: 13.5 % (ref 11.5–15.5)
WBC: 8.5 10*3/uL (ref 4.0–10.5)

## 2017-02-13 MED ORDER — POTASSIUM CHLORIDE CRYS ER 20 MEQ PO TBCR
40.0000 meq | EXTENDED_RELEASE_TABLET | Freq: Once | ORAL | Status: AC
  Administered 2017-02-13: 40 meq via ORAL
  Filled 2017-02-13: qty 2

## 2017-02-13 NOTE — Discharge Summary (Signed)
[facility] Discharge Summary   Patient ID: Dakota Goodman MRN: [id number] DOB/AGE: 11/23/1990 26 y.o.  Admit date: 02/12/2017 Discharge date: 02/13/2017 Discharge Diagnosis Patient Active Problem List   Diagnosis Date Noted  . Abdominal pain 02/12/2017  . Enteritis 01/31/2017  . Acute appendicitis 01/26/2017  . Perforated appendicitis 09/07/2016   Imaging: Ct Abdomen Pelvis W Contrast  Result Date: 02/12/2017 CLINICAL DATA:  Low back pain abdominal pain. Appendectomy 01/27/2017. Elevated white blood cell count EXAM: CT ABDOMEN AND PELVIS WITH CONTRAST TECHNIQUE: Multidetector CT imaging of the abdomen and pelvis was performed using the standard protocol following bolus administration of intravenous contrast. CONTRAST:  ISOVUE-300 IOPAMIDOL (ISOVUE-300) INJECTION 61% COMPARISON:  CT 01/26/2017, 09/19/2016, 09/07/2016 FINDINGS: Lower chest: Lung bases are clear. Hepatobiliary: No focal hepatic lesion. No duct dilatation. Gallbladder normal by CT imaging. No distention. Common bile duct normal caliber. Pancreas: No pancreatic inflammation. Pancreatic duct is not visible and therefore not dilated. Spleen: Adrenals/urinary tract: Adrenal glands normal. Kidneys enhance uniformly ureters and bladder normal. Stomach/Bowel: Stomach, duodenum normal. There is considerable reduction in the small bowel dilatation is seen on comparison exam. Also reduction in the bowel wall enhancement seen on comparison exam. Mild bowel wall enhanced remains in distal small bowel. There remains some fecalization of enteric contents within distal small bowel (Image 40, series 6) No evidence of abscess within the mesenteries of the small bowel. No free fluid. Patient status post appendectomy. The ascending colon appears normal. Transverse colon appears normal and stool-filled. Descending colon and sigmoid colon are normal. Vascular/Lymphatic: Abdominal aorta is normal caliber. There is no retroperitoneal or  periportal lymphadenopathy. No pelvic lymphadenopathy. Reproductive: Prostate normal Other: No free-fluid abscess or inflammation abdomen or pelvis. Musculoskeletal: No aggressive osseous lesion. IMPRESSION: 1. Considerable reduction in small bowel inflammation and as well as small bowel dilatation compared to CT of 01/31/2017. Mild fecalization within the distal small bowel remains and mild bowel wall thickening / edema. 2. No evidence of abscess with the abdomen or pelvis. No abnormal fluid collections. 3. No evidence of biliary obstruction or hepatic abscess. 4. Kidneys enhance symmetrically . Electronically Signed   By: Genevive Bi M.D.   On: 02/12/2017 19:46   US Abdomen Limited Ruq  Result Date: 02/12/2017 CLINICAL DATA:  Abdominal pain for 1 day. EXAM: ULTRASOUND ABDOMEN LIMITED RIGHT UPPER QUADRANT COMPARISON:  CT abdomen/ pelvis earlier this day. FINDINGS: Gallbladder: Physiologically distended. No gallstones or wall thickening visualized. No sonographic Murphy sign noted by sonographer. Common bile duct: Diameter: 2 mm, normal. Liver: Round hyperechoic 1.5 cm lesion in the right lobe. Within normal limits in parenchymal echogenicity. Normal directional flow in the main portal vein. Minimal right upper quadrant free fluid. IMPRESSION: 1. No gallstones or biliary dilatation. 2. Hyperechoic 1.5 cm lesion in the right hepatic lobe, probable small hepatic hemangioma. Recommend follow-up ultrasound in 6 months to confirm stability. Electronically Signed   By: Rubye Oaks M.D.   On: 02/12/2017 22:35    Procedures None this admission  Hospital Course:  26 y.o. male who presented to [facility] with 24 hours of abdominal pain and low back pain. Pain started as generalized and gradually localized to epigastrium/RUQ. There was no associated fever, nausea, vomiting, or diarrhea.  He underwent laparoscopic appendectomy on 01/27/2017. His postoperative course was complicated by C. difficile colitis. He was  admitted for treatment of this and discharged. He was been home for 10 days before returning to the hospital with above pain. Workup showed WBC 19,000, CT  scan with resolving enteritis and no acute intra-abdominal findings, and RUQ U/S negative for gallbladder pathology. He did have a large stool burden on CT. Patient was admitted for observation and repeat CBC showed WBC normalized to 8,000. On hospital day #1 the patients pain was resolved, he was tolerating PO intake without nausea or vomiting, was afebrile, and medically stable for discharge. He will call our office as needed with questions/concerns.   Physical Exam: General:  Alert, NAD, pleasant, comfortable Pulm: normal effort CV: RRR, no m/r/g, pedal pulses 2 + BL Abd:  Soft, ND, non-tender, +BS, previous trochar sites c/d/i and healing appropriately without erythema, tenderness, or drainage.   Allergies as of 02/13/2017      Reactions   Other Other (See Comments)   No Dairy or Meat VEGAN   Sulfa Drugs Cross Reactors Hives      Medication List    STOP taking these medications   oxyCODONE 5 MG immediate release tablet Commonly known as:  Oxy IR/ROXICODONE     TAKE these medications   b complex vitamins tablet Take 1 tablet by mouth daily.   ferrous sulfate 325 (65 FE) MG tablet Take 325 mg by mouth daily with breakfast.   FIRVANQ 50 MG/ML Solr Generic drug:  Vancomycin HCl Take 2.5 mLs by mouth daily.   magnesium 30 MG tablet Take 30 mg by mouth daily.   multivitamin with minerals Tabs tablet Take 1 tablet by mouth daily.   ondansetron 4 MG disintegrating tablet Commonly known as:  ZOFRAN-ODT Take 1 tablet (4 mg total) by mouth every 6 (six) hours as needed for nausea.   PROBIOTIC PO Take 1 capsule by mouth daily.        Follow-up Information    [facility] [place]   Specialty:  General Surgery Why:  as needed Contact information: [address] [place]  [place] [id number] [phone]         Signed: Hosie SpangleElizabeth Simaan, [facility] 02/13/2017, 7:59 AM Pager: [phone] Consults: [phone] Mon-Fri 7:00 am-4:30 pm Sat-Sun 7:00 am-11:30 am

## 2017-02-13 NOTE — Progress Notes (Signed)
Patient tolerated breakfast well. No issues with nausea or vomiting. I went over d/c paperwork with him. PIV removed.

## 2018-12-18 IMAGING — CT CT ABD-PELV W/ CM
2 of 4 series · 16 of 46 positions shown, 18 images · IV contrast (Omni 300)
Comparison: None.

CLINICAL DATA: Nausea and vomiting.  Started last [REDACTED].

EXAM:
CT ABDOMEN AND PELVIS WITH CONTRAST
TECHNIQUE: Multidetector CT imaging of the abdomen and pelvis was performed
using the standard protocol following bolus administration of
intravenous contrast.
CONTRAST:  1 FOMX3D-799 IOPAMIDOL (FOMX3D-799) INJECTION 61%

[Series 2: a/p w/ 5mm · axial · 0.77mm/px · z∈[+929,+1359]mm · 13 of 94 slices shown, 15 images]
[im 4/94  soft-tissue]
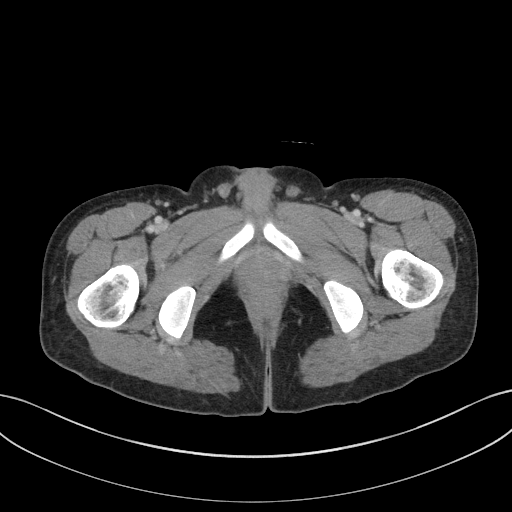
[im 4/94  bone]
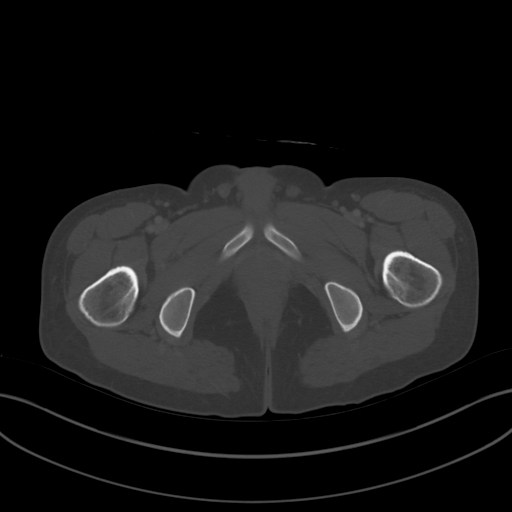
[im 12/94  soft-tissue]
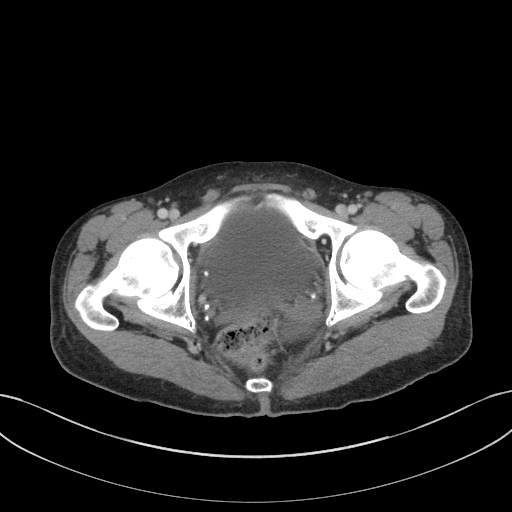
[im 19/94  soft-tissue]
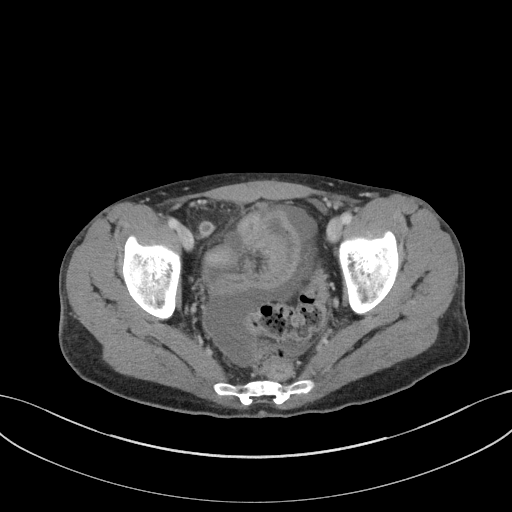
[im 27/94  soft-tissue]
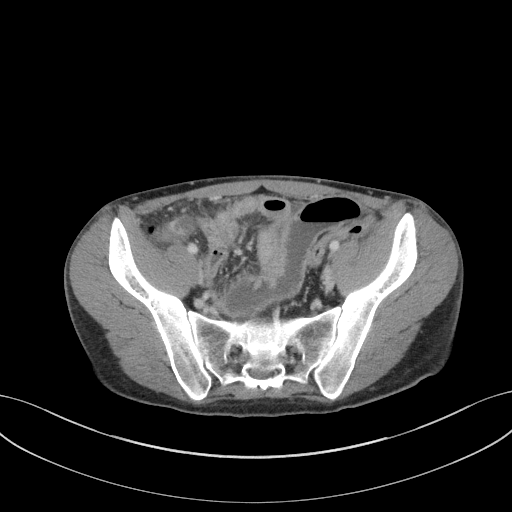
[im 34/94  soft-tissue]
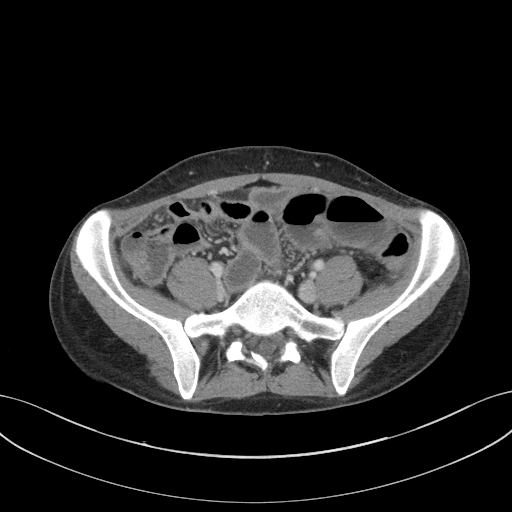
[im 41/94  soft-tissue]
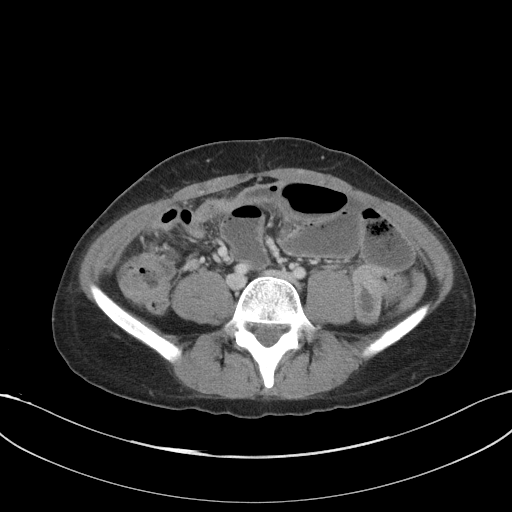
[im 49/94  soft-tissue]
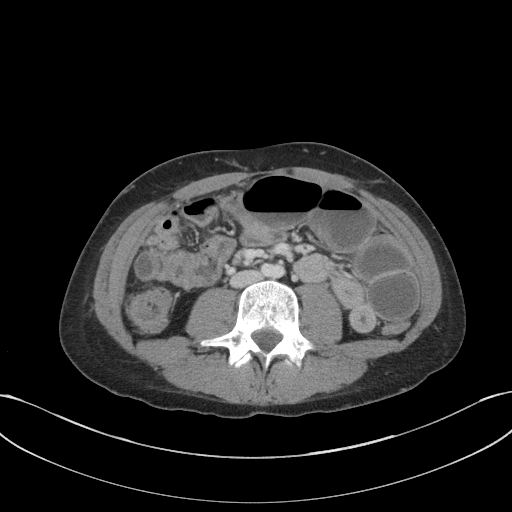
[im 53/94  soft-tissue]
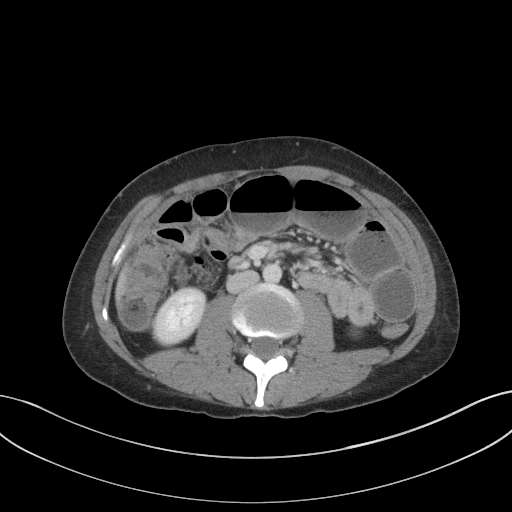
[im 60/94  soft-tissue]
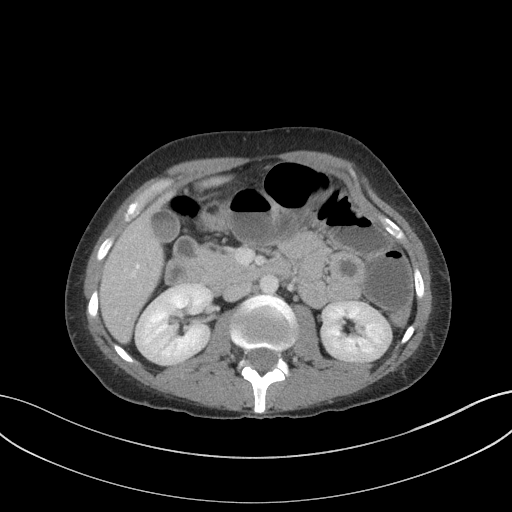
[im 60/94  bone]
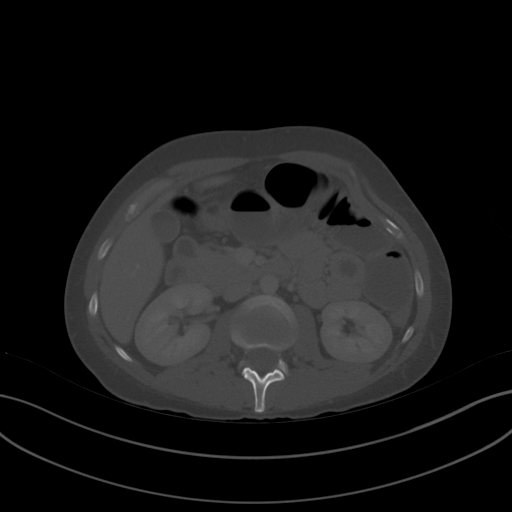
[im 67/94  soft-tissue]
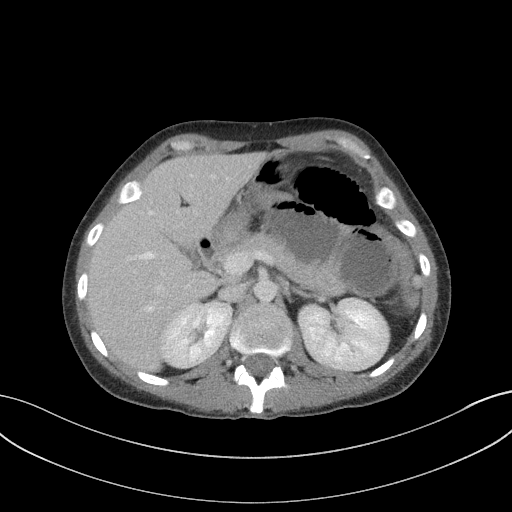
[im 75/94  soft-tissue]
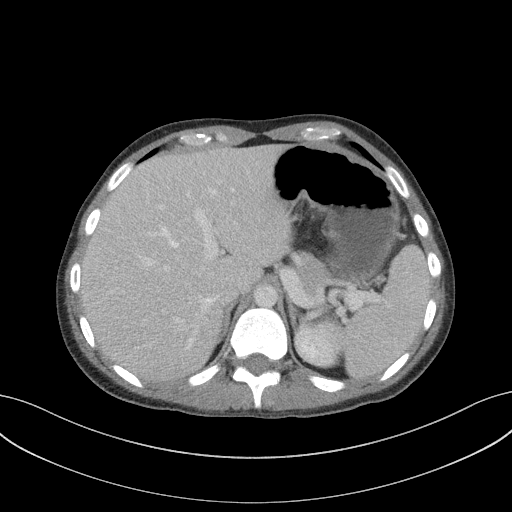
[im 82/94  soft-tissue]
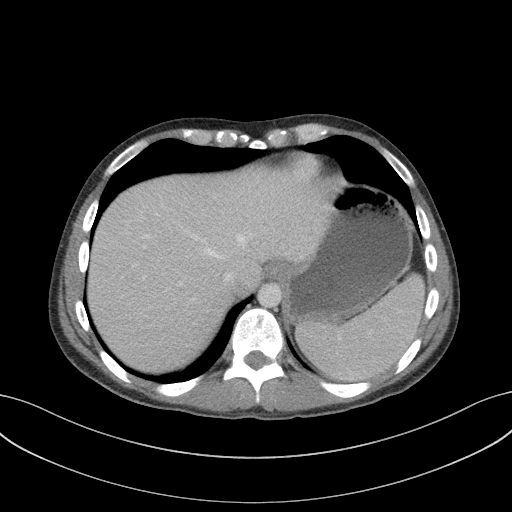
[im 90/94  soft-tissue]
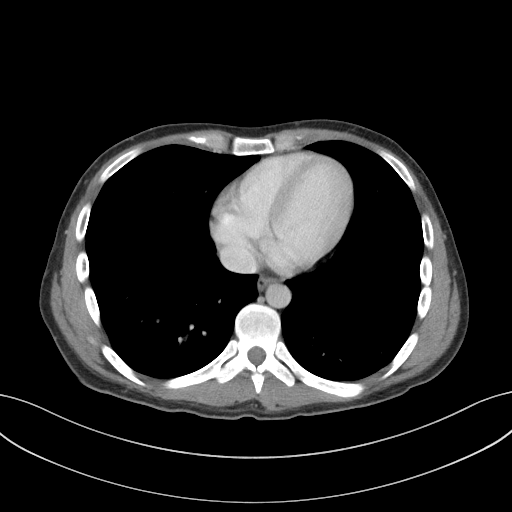

[Series 5: a/p w/ cor · coronal · 0.82mm/px · 3 of 120 slices shown]
[im 40/120  soft-tissue]
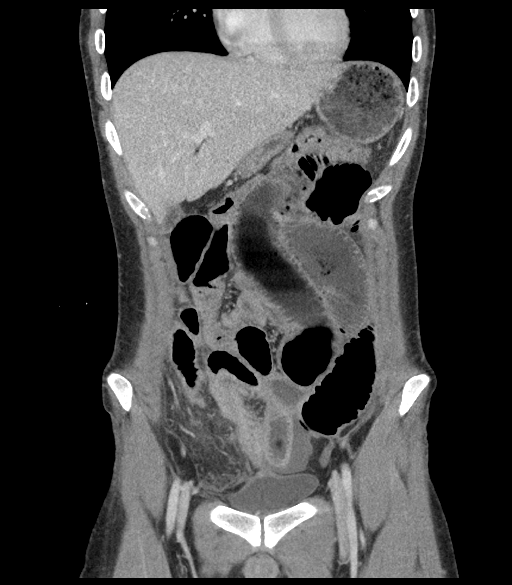
[im 53/120  soft-tissue]
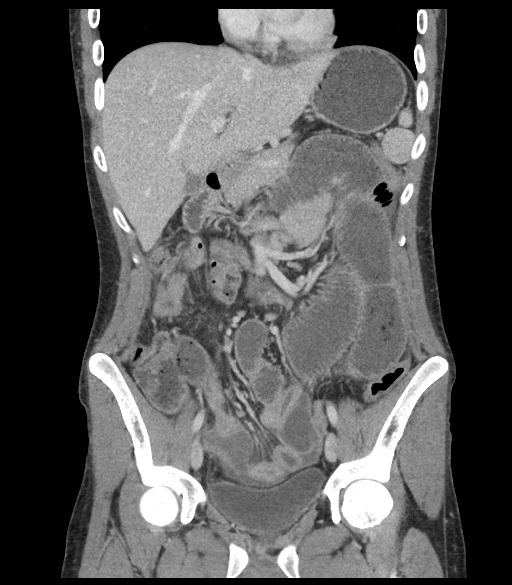
[im 67/120  soft-tissue]
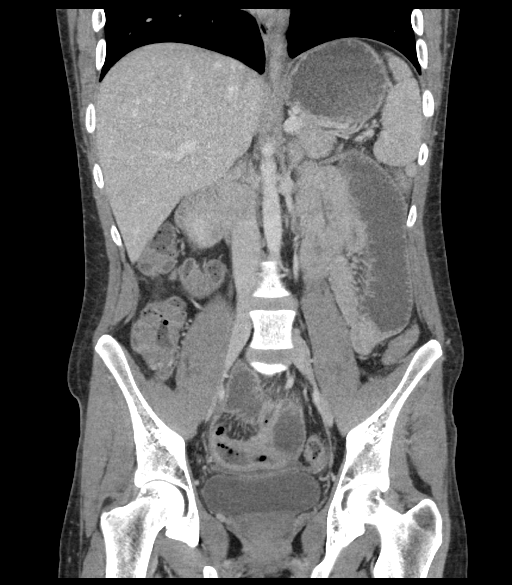

[16 of 46 positions shown; findings below may reference images not displayed]

FINDINGS: Lower chest: No acute abnormality.

Hepatobiliary: No focal liver abnormality is seen. No gallstones,
gallbladder wall thickening, or biliary dilatation.

Pancreas: Unremarkable. No pancreatic ductal dilatation or
surrounding inflammatory changes.

Spleen: Normal in size without focal abnormality.

Adrenals/Urinary Tract: Adrenal glands are unremarkable. Kidneys are
normal, without renal calculi, focal lesion, or hydronephrosis.
Bladder is unremarkable.

Stomach/Bowel: Dilated appendix measuring 11 mm with mucosal
enhancement most concerning for acute appendicitis. At the base of
the appendix there is mucosal discontinuity along filled with fluid
concerning for ruptured appendicitis. 4.2 x 2.7 cm fluid collection
in the right lower abdomen most consistent with an abscess. Multiple
small bowel air-fluid levels with mild bowel wall thickening
concerning for enteritis. No pneumatosis, pneumoperitoneum or portal
venous gas.

Vascular/Lymphatic: No significant vascular findings are present. No
enlarged abdominal or pelvic lymph nodes.

Reproductive: Prostate is unremarkable.

Other: No abdominal wall hernia or abnormality. No abdominopelvic
ascites.

Musculoskeletal: No acute or significant osseous findings.
IMPRESSION: 1. Findings most consistent with acute ruptured appendicitis. 4.2 x
2.7 cm fluid collection in the right lower abdomen most consistent
with an abscess. Moderate amount of pelvic fluid.
2. Multiple small bowel air-fluid levels with mild bowel wall
thickening concerning for enteritis secondary to appendiceal
rupture.
Critical Value/emergent results were called by telephone at the time
of interpretation on 09/07/2016 at [DATE] to Dr. GREFG DIPP ,
who verbally acknowledged these results.

## 2021-02-26 ENCOUNTER — Encounter (HOSPITAL_COMMUNITY): Payer: Self-pay | Admitting: Emergency Medicine

## 2021-02-26 ENCOUNTER — Other Ambulatory Visit: Payer: Self-pay

## 2021-02-26 ENCOUNTER — Ambulatory Visit (HOSPITAL_COMMUNITY)
Admission: EM | Admit: 2021-02-26 | Discharge: 2021-02-26 | Disposition: A | Discharge status: Home / Self Care | Payer: Self-pay | Attending: Internal Medicine | Admitting: Internal Medicine

## 2021-02-26 DIAGNOSIS — B028 Zoster with other complications: Secondary | ICD-10-CM

## 2021-02-26 MED ORDER — VALACYCLOVIR HCL 1 G PO TABS: 1000.0000 mg | ORAL_TABLET | Freq: Three times a day (TID) | ORAL | 0 refills | Status: AC

## 2021-02-26 MED ORDER — LIDOCAINE 5 % EX OINT: 1.0000 "application " | TOPICAL_OINTMENT | CUTANEOUS | 0 refills | Status: DC | PRN

## 2021-02-26 NOTE — ED Triage Notes (Signed)
PT has suspected shingles to left rib area. Pain preceded rash, rash erupted yesterday, is red and vesicular.

## 2021-02-26 NOTE — Discharge Instructions (Addendum)
Please take medications as prescribed If symptoms worsen please return to urgent care to be reevaluated.

## 2021-02-26 NOTE — ED Provider Notes (Signed)
[facility]    CSN: [id number] Arrival date & time: 02/26/21  1206      History   Chief Complaint Chief Complaint  Patient presents with   Rash    HPI Dakota Goodman is a 30 y.o. male comes to the urgent care with 5-day history of pain on the left side of the chest.  Patient noticed the rash a couple of days ago.  The rash is painful, associated with a burning sensation of the left side of the chest.  No fever or chills.  Patient has a history of chickenpox.Marland Kitchen   HPI  Past Medical History:  Diagnosis Date   Chronic back pain    "all over"   Family history of adverse reaction to anesthesia    "sister could feel qthing when she woke up; had to get adrenaline for low heart rate" (09/09/2016)    Patient Active Problem List   Diagnosis Date Noted   Abdominal pain 02/12/2017   Enteritis 01/31/2017   Acute appendicitis 01/26/2017   Perforated appendicitis 09/07/2016    Past Surgical History:  Procedure Laterality Date   ABSCESS DRAINAGE  09/07/2016   Percutaneous drainage of periappendiceal abscess in IR     APPENDECTOMY     IR RADIOLOGIST EVAL & MGMT  09/19/2016   LAPAROSCOPIC APPENDECTOMY N/A 01/27/2017   Procedure: APPENDECTOMY LAPAROSCOPIC;  Surgeon: Axel Filler, MD;  Location: [facility] OR;  Service: General;  Laterality: N/A;       Home Medications    Prior to Admission medications   Medication Sig Start Date End Date Taking? Authorizing Provider  b complex vitamins tablet Take 1 tablet by mouth daily.   Yes [provider]  ferrous sulfate 325 (65 FE) MG tablet Take 325 mg by mouth daily with breakfast.   Yes [provider]  lidocaine (XYLOCAINE) 5 % ointment Apply 1 application topically as needed. 02/26/21  Yes Toshiro Hanken, Britta Mccreedy, MD  magnesium 30 MG tablet Take 30 mg by mouth daily.   Yes [provider]  Multiple Vitamin (MULTIVITAMIN WITH MINERALS) TABS tablet Take 1 tablet by mouth daily.   Yes [provider]   valACYclovir (VALTREX) 1000 MG tablet Take 1 tablet (1,000 mg total) by mouth 3 (three) times daily for 10 days. 02/26/21 03/08/21 Yes Harris Kistler, Britta Mccreedy, MD  FIRVANQ 50 MG/ML SOLR Take 2.5 mLs by mouth daily. 02/03/17   [provider]  ondansetron (ZOFRAN-ODT) 4 MG disintegrating tablet Take 1 tablet (4 mg total) by mouth every 6 (six) hours as needed for nausea. 02/03/17   Focht, Joyce Copa, PA  Probiotic Product (PROBIOTIC PO) Take 1 capsule by mouth daily.    [provider]    Family History No family history on file.  Social History Social History   Tobacco Use   Smoking status: Never   Smokeless tobacco: Never  Vaping Use   Vaping Use: Never used  Substance Use Topics   Alcohol use: Yes    Comment: 09/09/2016 "1 beer last week; that was the first drink in years"   Drug use: No     Allergies   Other and Sulfa drugs cross reactors   Review of Systems Review of Systems  Skin:  Positive for color change and rash.  Neurological: Negative.   Psychiatric/Behavioral: Negative.      Physical Exam Triage Vital Signs ED Triage Vitals  Enc Vitals Group     BP 02/26/21 1302 135/72     Pulse Rate 02/26/21  1302 91     Resp 02/26/21 1302 16     Temp 02/26/21 1302 98.6 F (37 C)     Temp Source 02/26/21 1302 Oral     SpO2 02/26/21 1302 97 %     Weight --      Height --      Head Circumference --      Peak Flow --      Pain Score 02/26/21 1301 7     Pain Loc --      Pain Edu? --      Excl. in GC? --    No data found.  Updated Vital Signs BP 135/72   Pulse 91   Temp 98.6 F (37 C) (Oral)   Resp 16   SpO2 97%   Visual Acuity Right Eye Distance:   Left Eye Distance:   Bilateral Distance:    Right Eye Near:   Left Eye Near:    Bilateral Near:     Physical Exam Musculoskeletal:        General: Normal range of motion.  Skin:    General: Skin is warm.     Findings: Erythema and rash present.     UC Treatments / Results  Labs (all labs  ordered are listed, but only abnormal results are displayed) Labs Reviewed - No data to display  EKG   Radiology No results found.  Procedures Procedures (including critical care time)  Medications Ordered in UC Medications - No data to display  Initial Impression / Assessment and Plan / UC Course  I have reviewed the triage vital signs and the nursing notes.  Pertinent labs & imaging results that were available during my care of the patient were reviewed by me and considered in my medical decision making (see chart for details).     1.  Shingles, first episode: Valacyclovir 1 g 3 times daily for 10 days Lidocaine ointment as needed Return to urgent care if symptoms worsen. Final Clinical Impressions(s) / UC Diagnoses   Final diagnoses:  Herpes zoster with other complication     Discharge Instructions      Please take medications as prescribed If symptoms worsen please return to urgent care to be reevaluated.   ED Prescriptions     Medication Sig Dispense Auth. Provider   valACYclovir (VALTREX) 1000 MG tablet Take 1 tablet (1,000 mg total) by mouth 3 (three) times daily for 10 days. 30 tablet Toddy Boyd, Britta Mccreedy, MD   lidocaine (XYLOCAINE) 5 % ointment Apply 1 application topically as needed. 35.44 g Merrilee Jansky, MD      PDMP not reviewed this encounter.   Merrilee Jansky, MD 02/26/21 [id number]

## 2021-12-17 ENCOUNTER — Other Ambulatory Visit: Payer: Self-pay

## 2021-12-17 ENCOUNTER — Emergency Department (HOSPITAL_COMMUNITY)
Admission: EM | Admit: 2021-12-17 | Discharge: 2021-12-17 | Discharge status: Against Medical Advice | Payer: Self-pay | Attending: Emergency Medicine | Admitting: Emergency Medicine

## 2021-12-17 ENCOUNTER — Encounter (HOSPITAL_COMMUNITY): Payer: Self-pay

## 2021-12-17 DIAGNOSIS — Y93G9 Activity, other involving cooking and grilling: Secondary | ICD-10-CM | POA: Insufficient documentation

## 2021-12-17 DIAGNOSIS — S61214A Laceration without foreign body of right ring finger without damage to nail, initial encounter: Secondary | ICD-10-CM | POA: Insufficient documentation

## 2021-12-17 DIAGNOSIS — Z5321 Procedure and treatment not carried out due to patient leaving prior to being seen by health care provider: Secondary | ICD-10-CM | POA: Insufficient documentation

## 2021-12-17 DIAGNOSIS — W268XXA Contact with other sharp object(s), not elsewhere classified, initial encounter: Secondary | ICD-10-CM | POA: Insufficient documentation

## 2021-12-17 NOTE — ED Triage Notes (Signed)
Patient was working and sliced his right ring and pinky finger on a tea urn. Both look like paper cuts. Unknown when last tetanus was updated.

## 2022-05-31 ENCOUNTER — Encounter (HOSPITAL_COMMUNITY): Payer: Self-pay

## 2022-05-31 ENCOUNTER — Ambulatory Visit (HOSPITAL_COMMUNITY)
Admission: EM | Admit: 2022-05-31 | Discharge: 2022-05-31 | Disposition: A | Discharge status: Home / Self Care | Payer: BC Managed Care – PPO | Attending: Emergency Medicine | Admitting: Emergency Medicine

## 2022-05-31 DIAGNOSIS — W57XXXA Bitten or stung by nonvenomous insect and other nonvenomous arthropods, initial encounter: Secondary | ICD-10-CM | POA: Insufficient documentation

## 2022-05-31 DIAGNOSIS — X58XXXA Exposure to other specified factors, initial encounter: Secondary | ICD-10-CM | POA: Insufficient documentation

## 2022-05-31 DIAGNOSIS — R21 Rash and other nonspecific skin eruption: Secondary | ICD-10-CM | POA: Insufficient documentation

## 2022-05-31 DIAGNOSIS — S50861A Insect bite (nonvenomous) of right forearm, initial encounter: Secondary | ICD-10-CM | POA: Diagnosis not present

## 2022-05-31 MED ORDER — DOXYCYCLINE HYCLATE 100 MG PO CAPS: 100.0000 mg | ORAL_CAPSULE | Freq: Two times a day (BID) | ORAL | 0 refills | Status: AC

## 2022-05-31 NOTE — ED Triage Notes (Signed)
Pt states sore to right forearm for the past 2 days thinks it might be a possible bug bite.

## 2022-05-31 NOTE — Discharge Instructions (Addendum)
We will call you if your test results warrant a change in your plan of care.  You may view these test results on MyChart.   Doxycycline is being sent to the pharmacy, you will take this medication 2 times daily for the next 7 days.  Please make sure to take this medication with a full glass of water.    Please follow up with [place] if symptoms are not improving.

## 2022-05-31 NOTE — ED Provider Notes (Signed)
[facility]    CSN: [id number] Arrival date & time: 05/31/22  1323      History   Chief Complaint Chief Complaint  Patient presents with   Insect Bite    HPI Dakota Goodman is a 30 y.o. male.  Patient reports a bug bite to right forearm that occurred 2 days ago.  Patient reports doing some yard work the day prior to when his symptoms started. Patient is unaware of what possibly bit him. Patient reports increasing redness and pain.  Patient reports itchiness.  Patient has put Neosporin on the site with minimal relief of symptoms.  History of MRSA.   HPI  Past Medical History:  Diagnosis Date   Chronic back pain    "all over"   Family history of adverse reaction to anesthesia    "sister could feel qthing when she woke up; had to get adrenaline for low heart rate" (09/09/2016)    Patient Active Problem List   Diagnosis Date Noted   Abdominal pain 02/12/2017   Enteritis 01/31/2017   Acute appendicitis 01/26/2017   Perforated appendicitis 09/07/2016    Past Surgical History:  Procedure Laterality Date   ABSCESS DRAINAGE  09/07/2016   Percutaneous drainage of periappendiceal abscess in IR     APPENDECTOMY     IR RADIOLOGIST EVAL & MGMT  09/19/2016   LAPAROSCOPIC APPENDECTOMY N/A 01/27/2017   Procedure: APPENDECTOMY LAPAROSCOPIC;  Surgeon: Axel Filler, MD;  Location: [facility] OR;  Service: General;  Laterality: N/A;       Home Medications    Prior to Admission medications   Medication Sig Start Date End Date Taking? Authorizing Provider  doxycycline (VIBRAMYCIN) 100 MG capsule Take 1 capsule (100 mg total) by mouth 2 (two) times daily. 05/31/22  Yes Debby Freiberg, NP  b complex vitamins tablet Take 1 tablet by mouth daily.    [provider]  ferrous sulfate 325 (65 FE) MG tablet Take 325 mg by mouth daily with breakfast.    [provider]  magnesium 30 MG tablet Take 30 mg by mouth daily.    [provider]  Multiple  Vitamin (MULTIVITAMIN WITH MINERALS) TABS tablet Take 1 tablet by mouth daily.    [provider]  Probiotic Product (PROBIOTIC PO) Take 1 capsule by mouth daily.    [provider]    Family History History reviewed. No pertinent family history.  Social History Social History   Tobacco Use   Smoking status: Never   Smokeless tobacco: Never  Vaping Use   Vaping Use: Never used  Substance Use Topics   Alcohol use: Yes    Comment: 09/09/2016 "1 beer last week; that was the first drink in years"   Drug use: No     Allergies   Other and Sulfa drugs cross reactors   Review of Systems Review of Systems  Constitutional:  Negative for activity change, chills, fatigue and fever.  Respiratory: Negative.  Negative for chest tightness and shortness of breath.   Cardiovascular: Negative.  Negative for chest pain.  Musculoskeletal:  Negative for myalgias.  Skin:  Positive for rash (To right forearm).  Neurological: Negative.      Physical Exam Triage Vital Signs ED Triage Vitals [05/31/22 1425]  Enc Vitals Group     BP (!) 138/91     Pulse Rate 87     Resp 16     Temp 98.2 F (36.8 C)     Temp Source Oral  SpO2 99 %     Weight      Height      Head Circumference      Peak Flow      Pain Score 0     Pain Loc      Pain Edu?      Excl. in GC?    No data found.  Updated Vital Signs BP (!) 138/91 (BP Location: Left Arm)   Pulse 87   Temp 98.2 F (36.8 C) (Oral)   Resp 16   SpO2 99%       Physical Exam Vitals and nursing note reviewed.  Skin:    General: Skin is warm.     Capillary Refill: Capillary refill takes less than 2 seconds.     Findings: Erythema and rash present.          Comments: Erythemic rash present to right forearm, centralized area of darker pigment with circular ring appearance around the centralized area.       UC Treatments / Results  Labs (all labs ordered are listed, but only abnormal results are  displayed) Labs Reviewed  LYME DISEASE SEROLOGY W/REFLEX    EKG   Radiology No results found.  Procedures Procedures (including critical care time)  Medications Ordered in UC Medications - No data to display  Initial Impression / Assessment and Plan / UC Course  I have reviewed the triage vital signs and the nursing notes.  Pertinent labs & imaging results that were available during my care of the patient were reviewed by me and considered in my medical decision making (see chart for details).     Patient evaluated for bug bite.  Due to uncertainty of the kind of insect that bit him (and timeline), history of MRSA and appearance of rash doxycycline was ordered for 10 days. Lyme disease study ordered due to almost bulls eye appearance of rash and to rule out lyme disease.  Patient was made aware of treatment regiment and when follow-up would be necessary.  Patient was made aware of potential side effects of antimicrobial medication.  Patient was made aware of results reporting protocol and MyChart.  Patient verbalized understanding of instructions.   Charting was provided using a a verbal dictation system, charting was proofread for errors, errors may occur which could change the meaning of the information charted.   Final Clinical Impressions(s) / UC Diagnoses   Final diagnoses:  Insect bite of right forearm, initial encounter  Rash of unknown etiology     Discharge Instructions      We will call you if your test results warrant a change in your plan of care.  You may view these test results on MyChart.   Doxycycline is being sent to the pharmacy, you will take this medication 2 times daily for the next 7 days.  Please make sure to take this medication with a full glass of water.    Please follow up with [place] if symptoms are not improving.      ED Prescriptions     Medication Sig Dispense Auth. Provider   doxycycline (VIBRAMYCIN) 100 MG capsule Take 1 capsule (100 mg  total) by mouth 2 (two) times daily. 20 capsule Debby Freiberg, NP      PDMP not reviewed this encounter.   Debby Freiberg, NP 05/31/22 1534

## 2022-06-03 LAB — LYME DISEASE SEROLOGY W/REFLEX: Lyme Total Antibody EIA: NEGATIVE
